# Patient Record
Sex: Female | Born: 1960 | Hispanic: Yes | Marital: Married | Smoking: Never smoker
Health system: Southern US, Community
[De-identification: ages and names within clinical notes are randomized; demographics above are authoritative.]

## PROBLEM LIST (undated history)

## (undated) DIAGNOSIS — E7800 Pure hypercholesterolemia, unspecified: Secondary | ICD-10-CM

## (undated) HISTORY — PX: ABDOMINAL HYSTERECTOMY: SHX81

---

## 2011-04-02 ENCOUNTER — Observation Stay: Payer: Self-pay

## 2011-05-01 ENCOUNTER — Ambulatory Visit: Payer: Self-pay | Admitting: Obstetrics & Gynecology

## 2011-05-24 ENCOUNTER — Inpatient Hospital Stay: Payer: Self-pay | Admitting: Obstetrics & Gynecology

## 2011-05-28 LAB — PATHOLOGY REPORT

## 2013-06-14 ENCOUNTER — Ambulatory Visit: Payer: Self-pay | Admitting: Family Medicine

## 2013-09-23 ENCOUNTER — Ambulatory Visit: Payer: Self-pay | Admitting: Family Medicine

## 2013-10-26 ENCOUNTER — Ambulatory Visit: Payer: Self-pay | Admitting: Family Medicine

## 2014-12-21 ENCOUNTER — Ambulatory Visit
Admission: RE | Admit: 2014-12-21 | Discharge: 2014-12-21 | Disposition: A | Discharge status: Home / Self Care | Payer: Self-pay | Source: Ambulatory Visit | Attending: Oncology | Admitting: Oncology

## 2014-12-21 ENCOUNTER — Ambulatory Visit: Payer: BLUE CROSS/BLUE SHIELD | Attending: Oncology

## 2014-12-21 VITALS — BP 118/74 | HR 71 | Temp 97.1°F | Resp 18 | Ht 61.42 in | Wt 148.8 lb

## 2014-12-21 DIAGNOSIS — Z0000 Encounter for general adult medical examination without abnormal findings: Secondary | ICD-10-CM

## 2014-12-21 NOTE — Progress Notes (Signed)
Subjective:     Patient ID: Sharee Pimplentonia Barbosa Valerio, female   DOB: 1961/02/28, 54 y.o.   MRN: [id number]  HPI   Review of Systems     Objective:   Physical Exam  Pulmonary/Chest: Right breast exhibits no inverted nipple, no mass, no nipple discharge, no skin change and no tenderness. Left breast exhibits no inverted nipple, no mass, no nipple discharge, no skin change and no tenderness. Breasts are symmetrical.       Assessment:     54 year old patient presents for [facility] clinic visit.  Patient screened, and meets BCCCP eligibility.  Patient does not have insurance, Medicare or Medicaid.  Handout given on Affordable Care Act. Instructed patient on breast self-exam using teach back method.  CBE unremarkable    Plan:     Sent for bilateral screening mammogram.  Language line used for interpretation of exam.

## 2014-12-23 ENCOUNTER — Other Ambulatory Visit: Payer: Self-pay | Admitting: Oncology

## 2014-12-23 DIAGNOSIS — R928 Other abnormal and inconclusive findings on diagnostic imaging of breast: Secondary | ICD-10-CM

## 2014-12-23 DIAGNOSIS — N6489 Other specified disorders of breast: Secondary | ICD-10-CM

## 2015-01-02 ENCOUNTER — Ambulatory Visit
Admission: RE | Admit: 2015-01-02 | Discharge: 2015-01-02 | Disposition: A | Discharge status: Home / Self Care | Payer: BLUE CROSS/BLUE SHIELD | Source: Ambulatory Visit | Attending: Oncology | Admitting: Oncology

## 2015-01-02 ENCOUNTER — Ambulatory Visit
Admission: RE | Admit: 2015-01-02 | Discharge: 2015-01-02 | Disposition: A | Discharge status: Home / Self Care | Payer: Self-pay | Source: Ambulatory Visit | Attending: Oncology | Admitting: Oncology

## 2015-01-02 DIAGNOSIS — N6489 Other specified disorders of breast: Secondary | ICD-10-CM

## 2015-01-02 DIAGNOSIS — R928 Other abnormal and inconclusive findings on diagnostic imaging of breast: Secondary | ICD-10-CM

## 2015-01-09 ENCOUNTER — Encounter: Payer: Self-pay | Admitting: *Deleted

## 2015-01-24 ENCOUNTER — Other Ambulatory Visit: Payer: Self-pay

## 2015-01-24 DIAGNOSIS — N63 Unspecified lump in unspecified breast: Secondary | ICD-10-CM

## 2015-01-27 NOTE — Progress Notes (Signed)
Letter mailed to patient to notify of 6 month follow-up mammogram and ultrasound of left breast.  Scheduled for 07/06/15 at 0920 in the [facility].

## 2015-07-06 ENCOUNTER — Other Ambulatory Visit: Payer: BLUE CROSS/BLUE SHIELD

## 2015-07-06 ENCOUNTER — Ambulatory Visit: Payer: BLUE CROSS/BLUE SHIELD | Attending: Oncology

## 2017-04-09 ENCOUNTER — Ambulatory Visit: Payer: Self-pay | Attending: Oncology

## 2017-04-09 ENCOUNTER — Encounter (INDEPENDENT_AMBULATORY_CARE_PROVIDER_SITE_OTHER): Payer: Self-pay

## 2017-04-09 VITALS — BP 125/81 | HR 73 | Temp 97.4°F | Resp 18 | Ht 60.0 in | Wt 142.0 lb

## 2017-04-09 DIAGNOSIS — N63 Unspecified lump in unspecified breast: Secondary | ICD-10-CM

## 2017-04-09 NOTE — Progress Notes (Addendum)
Subjective:     Patient ID: Carolyn Hogan, female   DOB: 1960-08-21, 56 y.o.   MRN: [id number]  HPI   Review of Systems     Objective:   Physical Exam  Pulmonary/Chest: Right breast exhibits no inverted nipple, no mass, no nipple discharge, no skin change and no tenderness. Left breast exhibits no inverted nipple, no mass, no nipple discharge, no skin change and no tenderness. Breasts are symmetrical.       Assessment:     56 year old hispanic patient patient presents for [facility] clinic visit.  Patient was to return in January 2017 for six month follow-up mammogram, but she was in [place]. Patient screened, and meets BCCCP eligibility.  Patient does not have insurance, Medicare or Medicaid.  Handout given on Affordable Care Act.  Instructed patient on breast self-exam using teach back method.  CBE unremarkable.  No mass or lump palpated.  Carolyn Hogan interpreted exam.    Plan:     Carolyn Hogan to scheduled patient to return for bilateral diagnostic mammogram, and ultrasound.  Patient reports her father is in hospital in [place].  Instructed to call to reschedule if she needs to leave to go to [place].

## 2017-05-13 ENCOUNTER — Ambulatory Visit
Admission: RE | Admit: 2017-05-13 | Discharge: 2017-05-13 | Disposition: A | Discharge status: Home / Self Care | Payer: Self-pay | Source: Ambulatory Visit | Attending: Oncology | Admitting: Oncology

## 2017-05-13 DIAGNOSIS — N63 Unspecified lump in unspecified breast: Secondary | ICD-10-CM

## 2017-05-13 DIAGNOSIS — R928 Other abnormal and inconclusive findings on diagnostic imaging of breast: Secondary | ICD-10-CM | POA: Insufficient documentation

## 2017-05-16 NOTE — Progress Notes (Signed)
Radiologist reported, and discussed Birads 2 findings with patient.  Patient to return in one year for annual screening.  Copy to [facility].

## 2018-06-15 ENCOUNTER — Ambulatory Visit: Payer: Self-pay | Attending: Oncology

## 2018-06-15 ENCOUNTER — Ambulatory Visit
Admission: RE | Admit: 2018-06-15 | Discharge: 2018-06-15 | Disposition: A | Discharge status: Home / Self Care | Payer: Self-pay | Source: Ambulatory Visit | Attending: Oncology | Admitting: Oncology

## 2018-06-15 VITALS — BP 115/71 | HR 60 | Temp 98.1°F | Ht 61.5 in | Wt 142.3 lb

## 2018-06-15 DIAGNOSIS — Z0000 Encounter for general adult medical examination without abnormal findings: Secondary | ICD-10-CM | POA: Insufficient documentation

## 2018-06-15 NOTE — Progress Notes (Signed)
  Subjective:     Patient ID: Carolyn Hogan, female   DOB: April 26, 1961, 58 y.o.   MRN: [id number]  HPI   Review of Systems     Objective:   Physical Exam Chest:     Breasts: Breasts are symmetrical.        Right: No swelling, bleeding, inverted nipple, mass, nipple discharge, skin change or tenderness.        Left: No swelling, bleeding, inverted nipple, mass, nipple discharge, skin change or tenderness.        Assessment:     58 [facility] hispanic [facility].  Patient screened, and meets [facility]CP eligibility.  Patient does not have insurance, Medicare or Medicaid.  Handout given on Affordable Care Act.   Instructed patient on breast self awareness using teach back method.  Clinical breast exam unremarkable.  Patient reports she does not have family here except her husband, but her neighbor is with her.  Virtual interpreter service used to interpret exam.  Risk Assessment    Risk Scores      06/15/2018   Last edited by: Alta Corningover, Melissa G, CMA   5-year risk: 0.8 %   Lifetime risk: 5.8 %             Plan:     Sent for bilateral screening mammogram.

## 2018-06-17 NOTE — Progress Notes (Signed)
Letter mailed from [facility] to notify of normal mammogram results.  Patient to return in one year for annual screening.  Copy to [facility]. 

## 2019-09-15 ENCOUNTER — Other Ambulatory Visit: Payer: Self-pay | Admitting: Family Medicine

## 2019-09-15 DIAGNOSIS — Z1231 Encounter for screening mammogram for malignant neoplasm of breast: Secondary | ICD-10-CM

## 2019-09-24 ENCOUNTER — Ambulatory Visit
Admission: RE | Admit: 2019-09-24 | Discharge: 2019-09-24 | Disposition: A | Discharge status: Home / Self Care | Payer: BC Managed Care – PPO | Source: Ambulatory Visit | Attending: Family Medicine | Admitting: Family Medicine

## 2019-09-24 DIAGNOSIS — Z1231 Encounter for screening mammogram for malignant neoplasm of breast: Secondary | ICD-10-CM | POA: Insufficient documentation

## 2021-09-19 ENCOUNTER — Other Ambulatory Visit: Payer: Self-pay | Admitting: Student

## 2021-09-19 DIAGNOSIS — Z1231 Encounter for screening mammogram for malignant neoplasm of breast: Secondary | ICD-10-CM

## 2021-10-24 ENCOUNTER — Ambulatory Visit
Admission: RE | Admit: 2021-10-24 | Discharge: 2021-10-24 | Disposition: A | Discharge status: Home / Self Care | Payer: BC Managed Care – PPO | Source: Ambulatory Visit | Attending: Student | Admitting: Student

## 2021-10-24 DIAGNOSIS — Z1231 Encounter for screening mammogram for malignant neoplasm of breast: Secondary | ICD-10-CM | POA: Insufficient documentation

## 2022-07-04 ENCOUNTER — Other Ambulatory Visit: Payer: Self-pay | Admitting: Orthopedic Surgery

## 2022-07-08 ENCOUNTER — Encounter: Payer: Self-pay | Admitting: Orthopedic Surgery

## 2022-07-08 NOTE — Anesthesia Preprocedure Evaluation (Addendum)
Anesthesia Evaluation  Patient identified by MRN, date of birth, ID band Patient awake    Reviewed: Allergy & Precautions, H&P , NPO status , Patient's Chart, lab work & pertinent test results  History of Anesthesia Complications Negative for: history of anesthetic complications  Airway Mallampati: IV   Neck ROM: Full    Dental  (+) Missing   Pulmonary neg pulmonary ROS   Pulmonary exam normal breath sounds clear to auscultation       Cardiovascular negative cardio ROS Normal cardiovascular exam Rhythm:Regular Rate:Normal     Neuro/Psych negative neurological ROS     GI/Hepatic negative GI ROS,,,  Endo/Other  negative endocrine ROS    Renal/GU negative Renal ROS     Musculoskeletal   Abdominal   Peds  Hematology negative hematology ROS (+)   Anesthesia Other Findings   Reproductive/Obstetrics                             Anesthesia Physical Anesthesia Plan  ASA: 1  Anesthesia Plan: General and Regional   Post-op Pain Management: Regional block   Induction: Intravenous  PONV Risk Score and Plan: 1 and Treatment may vary due to age or medical condition and Ondansetron  Airway Management Planned: LMA  Additional Equipment:   Intra-op Plan:   Post-operative Plan: Extubation in OR  Informed Consent: I have reviewed the patients History and Physical, chart, labs and discussed the procedure including the risks, benefits and alternatives for the proposed anesthesia with the patient or authorized representative who has indicated his/her understanding and acceptance.     Dental advisory given  Plan Discussed with: CRNA  Anesthesia Plan Comments: (Plan for preoperative supraclavicular nerve block and GA with LMA.  Patient consented for risks of anesthesia including but not limited to:  - adverse reactions to medications - damage to eyes, teeth, lips or other oral mucosa - nerve  damage due to positioning  - sore throat or hoarseness - damage to heart, brain, nerves, lungs, other parts of body or loss of life  Informed patient about role of CRNA in peri- and intra-operative care.  Patient voiced understanding.)        Anesthesia Quick Evaluation

## 2022-07-12 ENCOUNTER — Other Ambulatory Visit: Payer: Self-pay

## 2022-07-12 ENCOUNTER — Ambulatory Visit
Admission: RE | Disposition: A | Discharge status: Home / Self Care | Payer: Self-pay | Source: Ambulatory Visit | Attending: Orthopedic Surgery

## 2022-07-12 ENCOUNTER — Ambulatory Visit
Admission: RE | Admit: 2022-07-12 | Discharge: 2022-07-12 | Disposition: A | Discharge status: Home / Self Care | Payer: Worker's Compensation | Source: Ambulatory Visit | Attending: Orthopedic Surgery | Admitting: Orthopedic Surgery

## 2022-07-12 ENCOUNTER — Ambulatory Visit: Payer: Self-pay

## 2022-07-12 ENCOUNTER — Ambulatory Visit: Payer: Worker's Compensation | Admitting: Anesthesiology

## 2022-07-12 DIAGNOSIS — X58XXXA Exposure to other specified factors, initial encounter: Secondary | ICD-10-CM | POA: Insufficient documentation

## 2022-07-12 DIAGNOSIS — S52572A Other intraarticular fracture of lower end of left radius, initial encounter for closed fracture: Secondary | ICD-10-CM | POA: Diagnosis present

## 2022-07-12 HISTORY — DX: Pure hypercholesterolemia, unspecified: E78.00

## 2022-07-12 HISTORY — PX: OPEN REDUCTION INTERNAL FIXATION (ORIF) DISTAL RADIAL FRACTURE: SHX5989

## 2022-07-12 SURGERY — OPEN REDUCTION INTERNAL FIXATION (ORIF) DISTAL RADIUS FRACTURE
Anesthesia: Regional | Site: Hand | Laterality: Left

## 2022-07-12 MED ORDER — MIDAZOLAM HCL 5 MG/5ML IJ SOLN
INTRAMUSCULAR | Status: DC | PRN
  Administered 2022-07-12: 1 mg via INTRAVENOUS

## 2022-07-12 MED ORDER — FENTANYL CITRATE PF 50 MCG/ML IJ SOSY: 25.0000 ug | PREFILLED_SYRINGE | INTRAMUSCULAR | Status: DC | PRN

## 2022-07-12 MED ORDER — OXYCODONE HCL 5 MG/5ML PO SOLN: 5.0000 mg | Freq: Once | ORAL | Status: AC | PRN

## 2022-07-12 MED ORDER — FENTANYL CITRATE (PF) 100 MCG/2ML IJ SOLN
INTRAMUSCULAR | Status: DC | PRN
  Administered 2022-07-12 (×2): 50 ug via INTRAVENOUS

## 2022-07-12 MED ORDER — HYDROCODONE-ACETAMINOPHEN 7.5-325 MG PO TABS: 1.0000 | ORAL_TABLET | ORAL | Status: DC | PRN

## 2022-07-12 MED ORDER — DROPERIDOL 2.5 MG/ML IJ SOLN: 0.6250 mg | Freq: Once | INTRAMUSCULAR | Status: DC | PRN

## 2022-07-12 MED ORDER — ACETAMINOPHEN 325 MG PO TABS: 325.0000 mg | ORAL_TABLET | Freq: Four times a day (QID) | ORAL | Status: DC | PRN

## 2022-07-12 MED ORDER — METOCLOPRAMIDE HCL 5 MG/ML IJ SOLN: 5.0000 mg | Freq: Three times a day (TID) | INTRAMUSCULAR | Status: DC | PRN

## 2022-07-12 MED ORDER — METOCLOPRAMIDE HCL 5 MG PO TABS: 5.0000 mg | ORAL_TABLET | Freq: Three times a day (TID) | ORAL | Status: DC | PRN

## 2022-07-12 MED ORDER — ONDANSETRON HCL 4 MG/2ML IJ SOLN
INTRAMUSCULAR | Status: DC | PRN
  Administered 2022-07-12: 4 mg via INTRAVENOUS

## 2022-07-12 MED ORDER — BUPIVACAINE HCL (PF) 0.5 % IJ SOLN
INTRAMUSCULAR | Status: DC | PRN
  Administered 2022-07-12: 20 mL

## 2022-07-12 MED ORDER — ONDANSETRON HCL 4 MG PO TABS: 4.0000 mg | ORAL_TABLET | Freq: Four times a day (QID) | ORAL | Status: DC | PRN

## 2022-07-12 MED ORDER — PROPOFOL 10 MG/ML IV BOLUS
INTRAVENOUS | Status: DC | PRN
  Administered 2022-07-12: 100 mg via INTRAVENOUS

## 2022-07-12 MED ORDER — ACETAMINOPHEN 10 MG/ML IV SOLN: 1000.0000 mg | Freq: Once | INTRAVENOUS | Status: DC | PRN

## 2022-07-12 MED ORDER — DEXAMETHASONE SODIUM PHOSPHATE 4 MG/ML IJ SOLN
INTRAMUSCULAR | Status: DC | PRN
  Administered 2022-07-12: 4 mg via INTRAVENOUS

## 2022-07-12 MED ORDER — HYDROCODONE-ACETAMINOPHEN 5-325 MG PO TABS: 1.0000 | ORAL_TABLET | ORAL | 0 refills | Status: AC | PRN

## 2022-07-12 MED ORDER — CEFAZOLIN SODIUM-DEXTROSE 2-4 GM/100ML-% IV SOLN
2.0000 g | INTRAVENOUS | Status: AC
  Administered 2022-07-12: 2 g via INTRAVENOUS

## 2022-07-12 MED ORDER — MORPHINE SULFATE (PF) 2 MG/ML IV SOLN: 0.5000 mg | INTRAVENOUS | Status: DC | PRN

## 2022-07-12 MED ORDER — ONDANSETRON HCL 4 MG/2ML IJ SOLN: 4.0000 mg | Freq: Four times a day (QID) | INTRAMUSCULAR | Status: DC | PRN

## 2022-07-12 MED ORDER — PROMETHAZINE HCL 25 MG/ML IJ SOLN: 6.2500 mg | INTRAMUSCULAR | Status: DC | PRN

## 2022-07-12 MED ORDER — HYDROCODONE-ACETAMINOPHEN 5-325 MG PO TABS: 1.0000 | ORAL_TABLET | ORAL | Status: DC | PRN

## 2022-07-12 MED ORDER — LACTATED RINGERS IV SOLN: INTRAVENOUS | Status: DC

## 2022-07-12 MED ORDER — OXYCODONE HCL 5 MG PO TABS
5.0000 mg | ORAL_TABLET | Freq: Once | ORAL | Status: AC | PRN
  Administered 2022-07-12: 5 mg via ORAL

## 2022-07-12 MED ORDER — 0.9 % SODIUM CHLORIDE (POUR BTL) OPTIME: TOPICAL | Status: DC | PRN

## 2022-07-12 MED ORDER — SODIUM CHLORIDE 0.9 % IV SOLN: INTRAVENOUS | Status: DC

## 2022-07-12 MED ORDER — EPHEDRINE SULFATE (PRESSORS) 50 MG/ML IJ SOLN
INTRAMUSCULAR | Status: DC | PRN
  Administered 2022-07-12 (×3): 5 mg via INTRAVENOUS

## 2022-07-12 SURGICAL SUPPLY — 38 items
APL PRP STRL LF DISP 70% ISPRP (MISCELLANEOUS) ×1
BIT DRILL 2 FAST STEP (BIT) IMPLANT
BIT DRILL 2.5X4 QC (BIT) IMPLANT
BNDG ELASTIC 4X5.8 VLCR STR LF (GAUZE/BANDAGES/DRESSINGS) ×1 IMPLANT
CHLORAPREP W/TINT 26 (MISCELLANEOUS) ×1 IMPLANT
COVER LIGHT HANDLE UNIVERSAL (MISCELLANEOUS) ×2 IMPLANT
CUFF TOURN SGL QUICK 18X4 (TOURNIQUET CUFF) IMPLANT
DRAPE FLUOR MINI C-ARM 54X84 (DRAPES) ×1 IMPLANT
ELECT REM PT RETURN 9FT ADLT (ELECTROSURGICAL) ×1
ELECTRODE REM PT RTRN 9FT ADLT (ELECTROSURGICAL) ×1 IMPLANT
GAUZE SPONGE 4X4 12PLY STRL (GAUZE/BANDAGES/DRESSINGS) ×1 IMPLANT
GAUZE XEROFORM 1X8 LF (GAUZE/BANDAGES/DRESSINGS) ×2 IMPLANT
GLOVE SURG SYN 9.0  PF PI (GLOVE) ×1
GLOVE SURG SYN 9.0 PF PI (GLOVE) ×1 IMPLANT
GOWN STRL REUS W/ TWL LRG LVL3 (GOWN DISPOSABLE) ×1 IMPLANT
GOWN STRL REUS W/TWL LRG LVL3 (GOWN DISPOSABLE) ×1
K-WIRE 1.6 (WIRE) ×1
K-WIRE FX5X1.6XNS BN SS (WIRE) ×1
KIT TURNOVER KIT A (KITS) ×1 IMPLANT
KWIRE FX5X1.6XNS BN SS (WIRE) IMPLANT
NS IRRIG 500ML POUR BTL (IV SOLUTION) ×1 IMPLANT
PACK EXTREMITY ARMC (MISCELLANEOUS) ×1 IMPLANT
PAD CAST 4YDX4 CTTN HI CHSV (CAST SUPPLIES) ×2 IMPLANT
PADDING CAST COTTON 4X4 STRL (CAST SUPPLIES) ×2
PEG SUBCHONDRAL SMOOTH 2.0X14 (Peg) IMPLANT
PEG SUBCHONDRAL SMOOTH 2.0X18 (Peg) IMPLANT
PEG SUBCHONDRAL SMOOTH 2.0X20 (Peg) IMPLANT
PEG SUBCHONDRAL SMOOTH 2.0X22 (Peg) IMPLANT
PLATE SHORT 57 NRRW LT (Plate) IMPLANT
SCREW CORT 3.5X10 LNG (Screw) ×2 IMPLANT
SCREW MULTI DIRECT 18MM (Screw) IMPLANT
SPLINT CAST 1 STEP 3X12 (MISCELLANEOUS) ×1 IMPLANT
SUT ETHILON 4-0 (SUTURE) ×1
SUT ETHILON 4-0 FS2 18XMFL BLK (SUTURE) ×1
SUT VICRYL 3-0 27IN (SUTURE) ×1 IMPLANT
SUTURE ETHLN 4-0 FS2 18XMF BLK (SUTURE) ×1 IMPLANT
SYR 3ML LL SCALE MARK (SYRINGE) ×1 IMPLANT
WATER STERILE IRR 500ML POUR (IV SOLUTION) ×1 IMPLANT

## 2022-07-12 NOTE — Op Note (Signed)
07/12/2022  2:58 PM  PATIENT:  Carolyn Hogan  62 y.o. female  PRE-OPERATIVE DIAGNOSIS:  Closed Colles' fracture of left radius, initial encounter S52.532A  POST-OPERATIVE DIAGNOSIS:  Closed Colles' fracture of left radius  PROCEDURE:  Procedure(s): OPEN REDUCTION INTERNAL FIXATION (ORIF) DISTAL RADIUS FRACTURE (Left)  SURGEON: Laurene Footman, MD  ASSISTANTS: None  ANESTHESIA:   general  EBL:  Total I/O In: 500 [I.V.:500] Out: 30 [Blood:30]  BLOOD ADMINISTERED:none  DRAINS: none   LOCAL MEDICATIONS USED:  NONE  SPECIMEN:  No Specimen  DISPOSITION OF SPECIMEN:  N/A  COUNTS:  YES  TOURNIQUET:   Total Tourniquet Time Documented: Upper Arm (Left) - 35 minutes Total: Upper Arm (Left) - 35 minutes   IMPLANTS: Biomet standard narrow DVR plate with multiple pegs and screws  DICTATION: .Dragon Dictation  patient was brought to the operating room general anesthesia was obtained.  The left arm was prepped and draped in usual sterile fashion with a tourniquet applied the upper arm.  Fingertrap traction applied with 8 pounds of traction after appropriate patient identification and timeout procedures were completed tourniquet was raised and a volar approach was made centered over the FCR tendon.  The tendon sheath was incised and the tendon retracted radially protect the radial artery and associated veins.  Deep fascia incised and retractor placed exposing the pronator muscle which was elevated off the radial side of the proximal and distal fragments.  The fracture was reduced with a Soil scientist and traction applied to the index and middle fingers.  With the fracture and held in a reduced position the volar plate was applied with a K wire holding it in position followed by placement of all the smooth pegs into the distal fragment with one of the holes it being filled with a multidirectional screw to get better fixation..  The plate was then brought down to the shaft with 4 10  mm cortical screws this gave near anatomic alignment.   At this point the fracture appeared well reduced on AP and lateral imaging with the mini C arm.  The wound was irrigated and tourniquet let down.  The wound was closed with 3-0 Vicryl subcutaneously and 4-0 skin nylon.  Xeroform 4 x 4 web roll volar splint and Ace wrap were then applied.  PLAN OF CARE: Discharge to home after PACU  PATIENT DISPOSITION:  PACU - hemodynamically stable.

## 2022-07-12 NOTE — H&P (Signed)
   Chief Complaint Patient presents with Left Wrist - Pain   History of the Present Illness: Carolyn Hogan is a 62 y.o. female here today.  The patient presents for evaluation of an injury from work. She was seen by internal medicine here at [facility]. She has a comminuted intra-articular fracture of the distal radius with displacement of the left wrist.  She presents with a female companion who states the patient does a lot of physical work and uses both arms constantly. She has not returned to work since the injury.  She is right-hand dominant.  She is employed at [employer].  I have reviewed past medical, surgical, social and family history, and allergies as documented in the EMR.  Past Medical History: History reviewed. No pertinent past medical history.  Past Surgical History: Past Surgical History: Procedure Laterality Date HYSTERECTOMY  Past Family History: Family History Problem Relation Age of Onset Diabetes type II Father  Medications: Current Outpatient Medications Ordered in Epic Medication Sig Dispense Refill HYDROcodone-acetaminophen (NORCO) 5-325 mg tablet Take 1 tablet by mouth every 6 (six) hours as needed for Pain 15 tablet 0  No current Epic-ordered facility-administered medications on file.  Allergies: No Known Allergies  Body mass index is 28.07 kg/m.  Review of Systems: A comprehensive 14 point ROS was performed, reviewed, and the pertinent orthopaedic findings are documented in the HPI.  Vitals: 06/28/22 1059 BP: (!) 156/78   General Physical Examination:  General/Constitutional: No apparent distress: well-nourished and well developed. Eyes: Pupils equal, round with synchronous movement. Lungs: Clear to auscultation HEENT: Normal Vascular: No edema, swelling or tenderness, except as noted in detailed exam. Cardiac: Heart rate and rhythm is regular. Integumentary: No impressive skin lesions present, except as noted in  detailed exam. Neuro/Psych: Normal mood and affect, oriented to person, place, and time.  On exam, sensation intact to the left fingers.  Radiographs:  Prior xrays reviewed, comminuted displaced distal radius.  Assessment: ICD-10-CM 1. Closed Colles' fracture of left radius, initial encounter S52.532A  Plan:  The patient has clinical findings of comminuted intra-articular fracture of the distal radius with displacement of the left wrist.  We will work on getting workers' compensation approval for ORIF of the left wrist with an interpreter here in the office. We have discussed risks, benefits, and possible complications. I would like to get that done next week if we can get authorization. I wrote her a work note stating she is out of work for 6 weeks.  Surgical Risks:  The nature of the condition and the proposed procedure has been reviewed in detail with the patient. Surgical versus non-surgical options and prognosis for recovery have been reviewed and the inherent risks and benefits of each have been discussed including the risks of infection, bleeding, injury to nerves/blood vessels/tendons, incomplete relief of symptoms, persisting pain and/or stiffness, loss of function, complex regional pain syndrome, failure of the procedure, as appropriate.  Document Attestation: I, Janalyn Rouse, have reviewed and updated documentation for [facility], MD, utilizing Nuance DAX.   Electronically signed by Lauris Poag, MD at 06/28/2022 12:22 PM EST   Reviewed  H+P. No changes noted.

## 2022-07-12 NOTE — Anesthesia Postprocedure Evaluation (Signed)
Anesthesia Post Note  Patient: Carolyn Hogan  Procedure(s) Performed: OPEN REDUCTION INTERNAL FIXATION (ORIF) DISTAL RADIUS FRACTURE (Left: Hand)  Patient location during evaluation: PACU Anesthesia Type: Regional Level of consciousness: awake and alert, oriented and patient cooperative Pain management: pain level controlled Vital Signs Assessment: post-procedure vital signs reviewed and stable Respiratory status: spontaneous breathing, nonlabored ventilation and respiratory function stable Cardiovascular status: blood pressure returned to baseline and stable Postop Assessment: adequate PO intake Anesthetic complications: no   No notable events documented.   Last Vitals:  Vitals:   07/12/22 1506 07/12/22 1515  BP:  132/64  Pulse: 71 65  Resp: 18 11  Temp:  (!) 36.2 C  SpO2: 100% 100%    Last Pain:  Vitals:   07/12/22 1515  TempSrc:   PainSc: [place]

## 2022-07-12 NOTE — Transfer of Care (Signed)
Immediate Anesthesia Transfer of Care Note  Patient: Carolyn Hogan  Procedure(s) Performed: OPEN REDUCTION INTERNAL FIXATION (ORIF) DISTAL RADIUS FRACTURE (Left: Hand)  Patient Location: PACU  Anesthesia Type: General, Regional  Level of Consciousness: awake, alert  and patient cooperative  Airway and Oxygen Therapy: Patient Spontanous Breathing and Patient connected to supplemental oxygen  Post-op Assessment: Post-op Vital signs reviewed, Patient's Cardiovascular Status Stable, Respiratory Function Stable, Patent Airway and No signs of Nausea or vomiting  Post-op Vital Signs: Reviewed and stable  Complications: No notable events documented.

## 2022-07-12 NOTE — Anesthesia Procedure Notes (Signed)
Anesthesia Regional Block: Supraclavicular block   Pre-Anesthetic Checklist: , timeout performed,  Correct Patient, Correct Site, Correct Laterality,  Correct Procedure,, risks and benefits discussed,  Surgical consent,  Pre-op evaluation,  At surgeon's request and post-op pain management  Laterality: Left  Prep: chloraprep       Needles:  Injection technique: Single-shot  Needle Type: Echogenic Needle          Additional Needles:   Procedures:,,,, ultrasound used (permanent image in chart),,   Motor weakness within 20 minutes.  Narrative:  Start time: 07/12/2022 12:25 PM End time: 07/12/2022 12:28 PM Injection made incrementally with aspirations every 5 mL.  Performed by: Personally  Anesthesiologist: Darrin Nipper, MD  Additional Notes: Functioning IV was confirmed and monitors applied.  Sterile prep and drape, hand hygiene and sterile gloves were used. Ultrasound guidance: relevant anatomy identified, needle position confirmed, local anesthetic spread visualized around nerve(s), vascular puncture avoided.  Image saved to electronic medical record.  Negative aspiration prior to incremental administration of local anesthetic for total 20 ml bupivacaine 0.5% given in supraclavicular distribution. The patient tolerated the procedure well. Vital signs and moderate sedation medications recorded in RN notes.

## 2022-07-12 NOTE — Discharge Instructions (Signed)
Arm elevated is much as possible Ice to the back of the hand today and tomorrow to help with swelling Leave dressing in place clean and dry Move fingers is much as you can Pain medicine as directed Call office if you are having problems. [phone] 

## 2022-07-12 NOTE — Anesthesia Procedure Notes (Signed)
Procedure Name: LMA Insertion Date/Time: 07/12/2022 1:58 PM  Performed by: Moises Blood, CRNAPre-anesthesia Checklist: Patient identified, Emergency Drugs available, Suction available, Patient being monitored and Timeout performed Patient Re-evaluated:Patient Re-evaluated prior to induction Oxygen Delivery Method: Circle system utilized Preoxygenation: Pre-oxygenation with 100% oxygen Induction Type: IV induction LMA: LMA inserted LMA Size: 4.0 Number of attempts: 1 Placement Confirmation: positive ETCO2 and breath sounds checked- equal and bilateral Dental Injury: Teeth and Oropharynx as per pre-operative assessment

## 2022-07-15 ENCOUNTER — Encounter: Payer: Self-pay | Admitting: Orthopedic Surgery

## 2022-08-06 ENCOUNTER — Ambulatory Visit: Payer: Worker's Compensation | Attending: Orthopedic Surgery | Admitting: Occupational Therapy

## 2022-08-06 ENCOUNTER — Encounter: Payer: Self-pay | Admitting: Occupational Therapy

## 2022-08-06 DIAGNOSIS — M25532 Pain in left wrist: Secondary | ICD-10-CM

## 2022-08-06 DIAGNOSIS — M79642 Pain in left hand: Secondary | ICD-10-CM | POA: Diagnosis present

## 2022-08-06 DIAGNOSIS — M6281 Muscle weakness (generalized): Secondary | ICD-10-CM | POA: Insufficient documentation

## 2022-08-06 DIAGNOSIS — M25632 Stiffness of left wrist, not elsewhere classified: Secondary | ICD-10-CM | POA: Diagnosis present

## 2022-08-06 DIAGNOSIS — L905 Scar conditions and fibrosis of skin: Secondary | ICD-10-CM | POA: Insufficient documentation

## 2022-08-06 DIAGNOSIS — M25642 Stiffness of left hand, not elsewhere classified: Secondary | ICD-10-CM

## 2022-08-06 NOTE — Therapy (Signed)
[facility] [address] [place], [address] Phone: [phone]   Fax:  [phone]  Occupational Therapy Evaluation  Patient Details  Name: Carolyn Hogan MRN: [id number] Date of Birth: 1960-08-12 Referring Provider (OT): [place]   Encounter Date: 08/06/2022   OT End of Session - 08/06/22 1452     Visit Number 1    Number of Visits 18    Date for OT Re-Evaluation 10/01/22    OT Start Time 1315    OT Stop Time 1408    OT Time Calculation (min) 53 min    Activity Tolerance Patient tolerated treatment well    Behavior During Therapy [facility] for tasks assessed/performed             Past Medical History:  Diagnosis Date   Hypercholesteremia     Past Surgical History:  Procedure Laterality Date   ABDOMINAL HYSTERECTOMY     OPEN REDUCTION INTERNAL FIXATION (ORIF) DISTAL RADIAL FRACTURE Left 07/12/2022   Procedure: OPEN REDUCTION INTERNAL FIXATION (ORIF) DISTAL RADIUS FRACTURE;  Surgeon: [name], MD;  Location: [place] [facility];  Service: Orthopedics;  Laterality: Left;    There were no vitals filed for this visit.   Subjective Assessment - 08/06/22 1443     Subjective  I tried to work on my fist at home but it hurts - soaked it it some warm water - tried to take the last strips off but it hurts - brace it just stiff - can I take if off yet    Pertinent History Ortho NOTE 07/26/22 -ORIF, left distal radius - 07/09/22, By Dr. [name] is a 62 y.o. female who presents today status post left distal radius ORIF by Dr. [name] on 07/12/2022. Patient has finished the Norco, has taken occasional ibuprofen only 3 tablets over the last couple of weeks. She is having some pain and swelling throughout the hand and wrist but overall symptoms are improving. She has performed very little exercises on her own, she is guarded secondary to discomfort. She denies any numbness or tingling. Sutures removed.  REFER to OT    Patient Stated Goals Want to get my L hand and wrist better to use it at home and work    Currently in Pain? Yes    Pain Score 4    10/10 in fingers when doing AAROM   Pain Location Hand   wrist   Pain Orientation Left    Pain Descriptors / Indicators Aching;Tightness;Sore;Tender;Numbness    Pain Type Surgical pain    Pain Onset 1 to 4 weeks ago    Pain Frequency Intermittent               [facility] OT Assessment - 08/06/22 0001       Assessment   Medical Diagnosis L ORIF distal radius fx    Referring Provider (OT) Arvella Nigh PA    Onset Date/Surgical Date 07/12/22    Hand Dominance Right      Precautions   Required Braces or Orthoses --   Brace on at all times up and about     [place] With Family      Prior Function   Vocation Full time employment    [place], house work, cooking      AROM   Overall AROM Comments Report some stiffness and pain in shoulder and med elbow    Left Forearm Pronation 90 Degrees  Left Forearm Supination 50 Degrees    Left Wrist Extension 8 Degrees    Left Wrist Flexion 50 Degrees    Left Wrist Radial Deviation 8 Degrees    Left Wrist Ulnar Deviation 20 Degrees      Left Hand AROM   L Thumb MCP 0-60 50 Degrees    L Thumb IP 0-80 45 Degrees    L Thumb Radial ADduction/ABduction 0-55 40    L Thumb Palmar ADduction/ABduction 0-45 50    L Thumb Opposition to Index --   Opposition to 3rd digit   L Index  MCP 0-90 60 Degrees    L Index PIP 0-100 80 Degrees    L Long  MCP 0-90 60 Degrees    L Long PIP 0-100 80 Degrees    L Ring  MCP 0-90 60 Degrees    L Ring PIP 0-100 80 Degrees    L Little  MCP 0-90 40 Degrees    L Little PIP 0-100 70 Degrees            Contrast done with patient prior to review of home program. Removed 4 Steri-Strips that was applied since 07/26/2022. Appear patient had been soaking her arm.  Small area pin sites of drainage. Applied 1 Steri-Strip.  Remind patient to only have  arm under running water. Could not initiate scar massage. Patient limited in pain with active range of motion. Reviewed after contrast with patient using contrast 3 times a day prior to home exercises. Active range of motion for palmar radial abduction of thumb 10 reps 10 reps of tendon glides with composite fist to 2 cm foam block out of palm. Keeping pain under 2/10. Thumb opposition picking up 1 cm up check alternating digits 8 reps Gentle pain-free active range of motion for radial ulnar deviation as well as wrist flexion extension.  8 reps.  Supination pronation with wrist splint on elbow to side 8 reps 10 reps Keeping pain under 1-2/10                  OT Education - 08/06/22 1452     Education Details Findings fof eval and HEP    Person(s) Educated Patient    Methods Explanation;Demonstration;Tactile cues;Verbal cues;Handout    Comprehension Verbal cues required;Returned demonstration;Verbalized understanding              OT Short Term Goals - 08/06/22 1749       OT SHORT TERM GOAL #1   Title Pt to be ind in HEP to decrease edema , pain and increase ROM in digits to touch palm and opposition to all digits    Baseline increase edema, sterristrips in place -pain 4-10/10 in hand and wrist with AROM - limited in flexion of thumb and digits    Time 3    Period Weeks    Status New    Target Date 08/27/22               OT Long Term Goals - 08/06/22 1751       OT LONG TERM GOAL #1   Title L wrist AROM increase to [facility] for pt to use hand to turn doorknob and use more than 50% in bathing and dressing    Baseline Wrist AROM ext 10, flexion 50; sup 50 and RD 8, UD 20- not using hand    Time 6    Period Weeks    Status New    Target Date 09/17/22  OT LONG TERM GOAL #2   Title Scar tissue improve for pt to tolerate scar massage, different textures to facilitate increase AROM at wrist flexion, ext and touching palm with digits    Baseline Sterri  srtrips removed today - applied one again - cannot initate scar massage yet- - decrease wrist and digits AROM    Time 4    Period Weeks    Status New    Target Date 09/03/22      OT LONG TERM GOAL #3   Title L Grip and prehension strenght incease to more than 60% compare to R hand for pt to carry more than 5 lbs, turn doorknob, cook using hand more than 50%    Baseline 3 1/2 wks s/p    Time 8    Period Weeks    Status New    Target Date 10/01/22      OT LONG TERM GOAL #4   Title L wrist and forearm strength increase to WNL for pt to push and pull heavy door, push up and turn doorknob and key without increase symptoms    Baseline 3 1/2 wks s/p - no strength yet    Time 8    Period Weeks    Status New    Target Date 10/01/22                   Plan - 08/06/22 1744     Clinical Impression Statement Pt present 3 1/2 wks s/p L ORIF distal radius fx - surgery by Dr [name] on 07/12/22 - pt in wrist prefab splint and sterri strips still in place. Sutures were removed on 07/26/22. Pt has pin size area that had some drainage- applied 1 sterristip and remind pt to only get hand under running water not soak it. Will cont to assess and initiate scar massage when appropriate. Pt with pain 4-10/10 in digits and wrist with AROM - pt limited in flexion of all digits including thumb -and end range extention of digits. Pt also with decrease L wrist and forearm AROM in all planes - Pt report some numbness in 5th digit and volar thenar eminence- pt can benefit from skilled OT services to decrease edema ,pain and scar tissue to increase ROM and strength in L hand and wrist to return to prior level of function.    OT Occupational Profile and History Problem Focused Assessment - Including review of records relating to presenting problem    Occupational performance deficits (Please refer to evaluation for details): ADL's;IADL's;Leisure;Social Participation;Work;Play    Body Structure / Function / Physical  Skills ADL;IADL;Strength;Pain;Edema;Sensation;Dexterity;Decreased knowledge of precautions;Flexibility;ROM;UE functional use;Scar mobility;FMC    Rehab Potential Good    Clinical Decision Making Limited treatment options, no task modification necessary    Comorbidities Affecting Occupational Performance: None    Modification or Assistance to Complete Evaluation  No modification of tasks or assist necessary to complete eval    OT Frequency 2x / week    OT Duration 8 weeks    OT Treatment/Interventions Self-care/ADL training;Contrast Bath;DME and/or AE instruction;Manual Therapy;Passive range of motion;Scar mobilization;Fluidtherapy;Paraffin;Splinting;Patient/family education;Therapeutic exercise    Consulted and Agree with Plan of Care Patient             Patient will benefit from skilled therapeutic intervention in order to improve the following deficits and impairments:   Body Structure / Function / Physical Skills: ADL, IADL, Strength, Pain, Edema, Sensation, Dexterity, Decreased knowledge of precautions, Flexibility, ROM, UE functional use, Scar mobility,  [facility]       Visit Diagnosis: Pain in left hand  Pain in left wrist  Scar condition and fibrosis of skin  Stiffness of left wrist, not elsewhere classified  Stiffness of left hand, not elsewhere classified  Muscle weakness (generalized)    Problem List There are no problems to display for this patient.   Rosalyn Gess, OTR/L,CLT 08/06/2022, 5:56 PM  [facility] [address] [place], [address] Phone: [phone]   Fax:  [phone]  Name: Carolyn Hogan MRN: [id number] Date of Birth: 1961/06/02

## 2022-08-06 NOTE — Addendum Note (Signed)
Addended by: Rosalyn Gess on: 08/06/2022 06:00 PM   Modules accepted: Orders

## 2022-08-09 ENCOUNTER — Encounter: Payer: Self-pay | Admitting: Orthopedic Surgery

## 2022-08-12 ENCOUNTER — Ambulatory Visit: Payer: Worker's Compensation | Attending: Orthopedic Surgery | Admitting: Occupational Therapy

## 2022-08-12 DIAGNOSIS — L905 Scar conditions and fibrosis of skin: Secondary | ICD-10-CM | POA: Diagnosis present

## 2022-08-12 DIAGNOSIS — M79642 Pain in left hand: Secondary | ICD-10-CM | POA: Insufficient documentation

## 2022-08-12 DIAGNOSIS — M6281 Muscle weakness (generalized): Secondary | ICD-10-CM | POA: Diagnosis present

## 2022-08-12 DIAGNOSIS — M25642 Stiffness of left hand, not elsewhere classified: Secondary | ICD-10-CM | POA: Diagnosis present

## 2022-08-12 DIAGNOSIS — M25532 Pain in left wrist: Secondary | ICD-10-CM | POA: Insufficient documentation

## 2022-08-12 DIAGNOSIS — M25632 Stiffness of left wrist, not elsewhere classified: Secondary | ICD-10-CM | POA: Insufficient documentation

## 2022-08-12 NOTE — Therapy (Signed)
[facility] [address], [place], [address] Phone: [phone]   Fax:  [phone]  Occupational Therapy Treatment  Patient Details  Name: Rinnah Beilstein MRN: [id number] Date of Birth: [birth year] Referring Provider (OT): [place]   Encounter Date: 08/12/2022   OT End of Session - 08/12/22 1434     Visit Number 2    Number of Visits 18    Date for OT Re-Evaluation 10/01/22    OT Start Time 1345    OT Stop Time 1428    OT Time Calculation (min) 43 min    Activity Tolerance Patient tolerated treatment well    Behavior During Therapy [facility] for tasks assessed/performed             Past Medical History:  Diagnosis Date   Hypercholesteremia     Past Surgical History:  Procedure Laterality Date   ABDOMINAL HYSTERECTOMY     OPEN REDUCTION INTERNAL FIXATION (ORIF) DISTAL RADIAL FRACTURE Left 07/12/2022   Procedure: OPEN REDUCTION INTERNAL FIXATION (ORIF) DISTAL RADIUS FRACTURE;  Surgeon: [name], MD;  Location: [place];  Service: Orthopedics;  Laterality: Left;    There were no vitals filed for this visit.   Subjective Assessment - 08/12/22 1432     Subjective  Pain in the back of my hand making fist - it hurts - cannot make fist -massage little my scar with cotton ball    Patient is accompanied by: Interpreter    Pertinent History Ortho NOTE 07/26/22 -ORIF, left distal radius - 07/09/22, By Dr. [name] is a 62 y.o. female who presents today status post left distal radius ORIF by Dr. [name] on 07/12/2022. Patient has finished the Norco, has taken occasional ibuprofen only 3 tablets over the last couple of weeks. She is having some pain and swelling throughout the hand and wrist but overall symptoms are improving. She has performed very little exercises on her own, she is guarded secondary to discomfort. She denies any numbness or tingling. Sutures removed. REFER to OT     Patient Stated Goals Want to get my L hand and wrist better to use it at home and work    Currently in Pain? Yes    Pain Score 8     Pain Location Hand    Pain Orientation Left   posterior hand with making fist   Pain Descriptors / Indicators Aching;Tightness    Pain Type Surgical pain    Pain Onset More than a month ago    Pain Frequency Intermittent                [facility] OT Assessment - 08/12/22 0001       AROM   Left Forearm Pronation 90 Degrees    Left Forearm Supination 60 Degrees    Left Wrist Extension 20 Degrees    Left Wrist Flexion 45 Degrees      Left Hand AROM   L Thumb MCP 0-60 55 Degrees    L Thumb IP 0-80 35 Degrees    L Thumb Radial ADduction/ABduction 0-55 55    L Thumb Palmar ADduction/ABduction 0-45 55    L Thumb Opposition to Index --   Opposition to all digits   L Index  MCP 0-90 60 Degrees    L Index PIP 0-100 90 Degrees    L Long  MCP 0-90 60 Degrees    L Long PIP 0-100 80 Degrees  L Ring  MCP 0-90 60 Degrees    L Ring PIP 0-100 90 Degrees    L Little  MCP 0-90 45 Degrees    L Little PIP 0-100 70 Degrees              Patient arrive with complaints of increased pain over posterior hand with fisting. Observed patient focusing more on DIP PIP flexion active assisted range of motion as well as flexing in to wrist flexion and during attempts of fisting causing discomfort and pain. Patient scar ready this date to initiate scar mobilization and massage. Provided patient with a [employer] scar pad at nighttime Patient added on scar massage 3 times a day.        OT Treatments/Exercises (OP) - 08/12/22 0001       LUE Fluidotherapy   Number Minutes Fluidotherapy 8 Minutes    LUE Fluidotherapy Location Hand;Wrist    Comments AROM for digits with ice 1 rotation             Contrast can be done in the water now.  3 times a day recommended followed by scar massage Active range of motion for palmar /radial abduction of thumb 10  reps Thumb opposition picking up 1 cm up check alternating digits 8 reps Extensively reviewed with patient tendon glides with active assisted range of motion focusing on MC flexion and then composite flexion maintaining MC flexion. Patient also to focus including fifth digit flexion during exercises 10 reps keeping pain to-3/10  Supination /pronation with wrist splint on elbow to side 10 reps Patient to hold off on wrist flexion extension and radial ulnar deviation until Friday's x-ray. Patient to focus on digits flexion as well as thumb. Keeping pain under 1-2/10 Soft tissue mobilization done for metacarpal spreads as well as thumb webspace prior to range of motion.                     OT Education - 08/12/22 1433     Education Details progress and reinforce HEP    Person(s) Educated Patient    Methods Explanation;Demonstration;Tactile cues;Verbal cues;Handout    Comprehension Verbal cues required;Returned demonstration;Verbalized understanding              OT Short Term Goals - 08/06/22 1749       OT SHORT TERM GOAL #1   Title Pt to be ind in HEP to decrease edema , pain and increase ROM in digits to touch palm and opposition to all digits    Baseline increase edema, sterristrips in place -pain 4-10/10 in hand and wrist with AROM - limited in flexion of thumb and digits    Time 3    Period Weeks    Status New    Target Date 08/27/22               OT Long Term Goals - 08/06/22 1751       OT LONG TERM GOAL #1   Title L wrist AROM increase to [facility] for pt to use hand to turn doorknob and use more than 50% in bathing and dressing    Baseline Wrist AROM ext 10, flexion 50; sup 50 and RD 8, UD 20- not using hand    Time 6    Period Weeks    Status New    Target Date 09/17/22      OT LONG TERM GOAL #2   Title Scar tissue improve for pt to tolerate scar massage, different  textures to facilitate increase AROM at wrist flexion, ext and touching palm with digits     Baseline Sterri srtrips removed today - applied one again - cannot initate scar massage yet- - decrease wrist and digits AROM    Time 4    Period Weeks    Status New    Target Date 09/03/22      OT LONG TERM GOAL #3   Title L Grip and prehension strenght incease to more than 60% compare to R hand for pt to carry more than 5 lbs, turn doorknob, cook using hand more than 50%    Baseline 3 1/2 wks s/p    Time 8    Period Weeks    Status New    Target Date 10/01/22      OT LONG TERM GOAL #4   Title L wrist and forearm strength increase to WNL for pt to push and pull heavy door, push up and turn doorknob and key without increase symptoms    Baseline 3 1/2 wks s/p - no strength yet    Time 8    Period Weeks    Status New    Target Date 10/01/22                   Plan - 08/12/22 1434     Clinical Impression Statement Pt present 4 1/2 wks s/p L ORIF distal radius fx - surgery by Dr [name] on 07/12/22 -  at eval last week pt has still sterri strip on but had to put back 2. Pt return today for education for scar massage and cica scar pad night time. Pt AROM for L  Flexion of digits about same reinforce multiple times for pt this sessions to block wrist and focus on MC flexion first , then DIP/PIP and then composite PROM but stop when feeling pull- reinforce to keep wrist straigth to decrease pain.  Opposition to all digits and thumb PA and RA improved.  Pt to focus on  digits, thumb and scar this week until MD appt Friday - then will focus on wrist. Pt  with decrease L wrist and forearm AROM in all planes - Pt report some numbness in 5th digit and volar thenar eminence- pt can benefit from skilled OT services to decrease edema ,pain and scar tissue to increase ROM and strength in L hand and wrist to return to prior level of function.    OT Occupational Profile and History Problem Focused Assessment - Including review of records relating to presenting problem    Occupational performance  deficits (Please refer to evaluation for details): ADL's;IADL's;Leisure;Social Participation;Work;Play    Body Structure / Function / Physical Skills ADL;IADL;Strength;Pain;Edema;Sensation;Dexterity;Decreased knowledge of precautions;Flexibility;ROM;UE functional use;Scar mobility;FMC    Rehab Potential Good    Clinical Decision Making Limited treatment options, no task modification necessary    Comorbidities Affecting Occupational Performance: None    Modification or Assistance to Complete Evaluation  No modification of tasks or assist necessary to complete eval    OT Frequency 2x / week    OT Duration 8 weeks    OT Treatment/Interventions Self-care/ADL training;Contrast Bath;DME and/or AE instruction;Manual Therapy;Passive range of motion;Scar mobilization;Fluidtherapy;Paraffin;Splinting;Patient/family education;Therapeutic exercise    Consulted and Agree with Plan of Care Patient             Patient will benefit from skilled therapeutic intervention in order to improve the following deficits and impairments:   Body Structure / Function / Physical Skills: ADL, IADL, Strength,  Pain, Edema, Sensation, Dexterity, Decreased knowledge of precautions, Flexibility, ROM, UE functional use, Scar mobility, [place]       Visit Diagnosis: Pain in left hand  Pain in left wrist  Scar condition and fibrosis of skin  Stiffness of left wrist, not elsewhere classified  Stiffness of left hand, not elsewhere classified  Muscle weakness (generalized)    Problem List There are no problems to display for this patient.   Rosalyn Gess, OTR/L,CLT 08/12/2022, 2:41 PM  [facility] [address], [place], [address] Phone: [phone]   Fax:  [phone]  Name: Madie Randa MRN: [id number] Date of Birth: [birth year]

## 2022-08-13 ENCOUNTER — Encounter: Payer: BC Managed Care – PPO | Admitting: Occupational Therapy

## 2022-08-15 ENCOUNTER — Ambulatory Visit: Payer: Worker's Compensation | Admitting: Occupational Therapy

## 2022-08-15 ENCOUNTER — Encounter: Payer: BC Managed Care – PPO | Admitting: Occupational Therapy

## 2022-08-15 DIAGNOSIS — M25632 Stiffness of left wrist, not elsewhere classified: Secondary | ICD-10-CM | POA: Diagnosis present

## 2022-08-15 DIAGNOSIS — M25532 Pain in left wrist: Secondary | ICD-10-CM | POA: Diagnosis present

## 2022-08-15 DIAGNOSIS — M79642 Pain in left hand: Secondary | ICD-10-CM

## 2022-08-15 DIAGNOSIS — M6281 Muscle weakness (generalized): Secondary | ICD-10-CM | POA: Diagnosis present

## 2022-08-15 DIAGNOSIS — L905 Scar conditions and fibrosis of skin: Secondary | ICD-10-CM

## 2022-08-15 DIAGNOSIS — M25642 Stiffness of left hand, not elsewhere classified: Secondary | ICD-10-CM

## 2022-08-15 NOTE — Therapy (Signed)
[facility] [address] [place], [address] Phone: [phone]   Fax:  [phone]  Occupational Therapy Treatment  Patient Details  Name: Tonnette Zatorski MRN: [id number] Date of Birth: November 28, 1960 Referring Provider (OT): [place]   Encounter Date: 08/15/2022   OT End of Session - 08/15/22 1258     Visit Number 3    Number of Visits 18    Date for OT Re-Evaluation 10/01/22    OT Start Time 1201    OT Stop Time 1245    OT Time Calculation (min) 44 min    Activity Tolerance Patient tolerated treatment well    Behavior During Therapy [facility] for tasks assessed/performed             Past Medical History:  Diagnosis Date   Hypercholesteremia     Past Surgical History:  Procedure Laterality Date   ABDOMINAL HYSTERECTOMY     OPEN REDUCTION INTERNAL FIXATION (ORIF) DISTAL RADIAL FRACTURE Left 07/12/2022   Procedure: OPEN REDUCTION INTERNAL FIXATION (ORIF) DISTAL RADIUS FRACTURE;  Surgeon: [name], MD;  Location: [place];  Service: Orthopedics;  Laterality: Left;    There were no vitals filed for this visit.   Subjective Assessment - 08/15/22 1225     Subjective  I can tell since last time more motion and strength - can brush my hair now    Pertinent History Ortho NOTE 07/26/22 -ORIF, left distal radius - 07/09/22, By Dr. [name] is a 62 y.o. female who presents today status post left distal radius ORIF by Dr. [name] on 07/12/2022. Patient has finished the Norco, has taken occasional ibuprofen only 3 tablets over the last couple of weeks. She is having some pain and swelling throughout the hand and wrist but overall symptoms are improving. She has performed very little exercises on her own, she is guarded secondary to discomfort. She denies any numbness or tingling. Sutures removed. REFER to OT    Patient Stated Goals Want to get my L hand and wrist better to use it at home and  work    Currently in Pain? Yes    Pain Score 6     Pain Location Hand    Pain Orientation Left    Pain Descriptors / Indicators Aching;Sore;Tightness    Pain Type Surgical pain    Aggravating Factors  making fist                [facility] OT Assessment - 08/15/22 0001       AROM   Left Forearm Pronation 90 Degrees    Left Forearm Supination 85 Degrees    Left Wrist Extension 22 Degrees    Left Wrist Flexion 52 Degrees    Left Wrist Radial Deviation 15 Degrees    Left Wrist Ulnar Deviation 15 Degrees      Left Hand AROM   L Index  MCP 0-90 70 Degrees    L Index PIP 0-100 90 Degrees    L Long  MCP 0-90 75 Degrees    L Long PIP 0-100 90 Degrees    L Ring  MCP 0-90 70 Degrees    L Ring PIP 0-100 95 Degrees    L Little  MCP 0-90 65 Degrees    L Little PIP 0-100 90 Degrees              Coming in with great progress since last visit in [facility] flexion and composite  flexion         OT Treatments/Exercises (OP) - 08/15/22 0001       LUE Fluidotherapy   Number Minutes Fluidotherapy 8 Minutes    LUE Fluidotherapy Location Hand;Wrist    Comments AROM for digits and wrist in all planes              Contrast can be done in the water now.  3 times a day recommended followed by scar massage Review again scar massage - and cica scar pad use night time   Active range of motion for palmar /radial abduction of thumb 10 reps Thumb opposition  alternating digits 8 reps and now sliding down 4th and 5th digits  Extensively reviewed with patient tendon glides  last visit - with active assisted range of motion focusing on MC flexion and then composite flexion maintaining MC flexion. Done PROM composite fist after AAROM tendon glides Done Place and hold touching palm 10 reps   review and add AAROM over edge of table for wrist flexion, ext, RD, UD  10 reps - slight pulle  Keeping pain under 1-2/10 2 x day Soft tissue mobilization done for metacarpal spreads as well as thumb  webspace prior to range of motion.        OT Short Term Goals - 08/06/22 1749       OT SHORT TERM GOAL #1   Title Pt to be ind in HEP to decrease edema , pain and increase ROM in digits to touch palm and opposition to all digits    Baseline increase edema, sterristrips in place -pain 4-10/10 in hand and wrist with AROM - limited in flexion of thumb and digits    Time 3    Period Weeks    Status New    Target Date 08/27/22               OT Long Term Goals - 08/06/22 1751       OT LONG TERM GOAL #1   Title L wrist AROM increase to [facility] for pt to use hand to turn doorknob and use more than 50% in bathing and dressing    Baseline Wrist AROM ext 10, flexion 50; sup 50 and RD 8, UD 20- not using hand    Time 6    Period Weeks    Status New    Target Date 09/17/22      OT LONG TERM GOAL #2   Title Scar tissue improve for pt to tolerate scar massage, different textures to facilitate increase AROM at wrist flexion, ext and touching palm with digits    Baseline Sterri srtrips removed today - applied one again - cannot initate scar massage yet- - decrease wrist and digits AROM    Time 4    Period Weeks    Status New    Target Date 09/03/22      OT LONG TERM GOAL #3   Title L Grip and prehension strenght incease to more than 60% compare to R hand for pt to carry more than 5 lbs, turn doorknob, cook using hand more than 50%    Baseline 3 1/2 wks s/p    Time 8    Period Weeks    Status New    Target Date 10/01/22      OT LONG TERM GOAL #4   Title L wrist and forearm strength increase to WNL for pt to push and pull heavy door, push up and turn doorknob and key  without increase symptoms    Baseline 3 1/2 wks s/p - no strength yet    Time 8    Period Weeks    Status New    Target Date 10/01/22                   Plan - 08/15/22 1258     Clinical Impression Statement Pt present 5 1/2 wks s/p L ORIF distal radius fx - surgery by Dr [name] on 07/12/22 -  at eval last  week pt has still sterri strip on but had to put back 2.  Pt needed reinforcement this week to focus on MC flexion and composite fist- arrive this date with good progress compare to earlier this week. Pain and tightness still 6-8/10 pain over dorsal hand but improving this date. Initiate this date AAROM for wrist flexion, ext and RD, UD but keeping pain under 2/10 for wrist. Cont wrist splint until surgeon appt. Review again scar massage and cica scar pad night time. Opposition to all digits and thumb PA and RA improved.  Pt to focus on composite digits flexion this next few days- appt tomorrow with Ortho.  Pt  with decrease L wrist and forearm AROM in all planes - Pt report some numbness in 5th digit and volar thenar eminence- pt can benefit from skilled OT services to decrease edema ,pain and scar tissue to increase ROM and strength in L hand and wrist to return to prior level of function.    OT Occupational Profile and History Problem Focused Assessment - Including review of records relating to presenting problem    Occupational performance deficits (Please refer to evaluation for details): ADL's;IADL's;Leisure;Social Participation;Work;Play    Body Structure / Function / Physical Skills ADL;IADL;Strength;Pain;Edema;Sensation;Dexterity;Decreased knowledge of precautions;Flexibility;ROM;UE functional use;Scar mobility;FMC    Rehab Potential Good    Clinical Decision Making Limited treatment options, no task modification necessary    Comorbidities Affecting Occupational Performance: None    Modification or Assistance to Complete Evaluation  No modification of tasks or assist necessary to complete eval    OT Frequency 2x / week    OT Duration 8 weeks    OT Treatment/Interventions Self-care/ADL training;Contrast Bath;DME and/or AE instruction;Manual Therapy;Passive range of motion;Scar mobilization;Fluidtherapy;Paraffin;Splinting;Patient/family education;Therapeutic exercise    Consulted and Agree with  Plan of Care Patient             Patient will benefit from skilled therapeutic intervention in order to improve the following deficits and impairments:   Body Structure / Function / Physical Skills: ADL, IADL, Strength, Pain, Edema, Sensation, Dexterity, Decreased knowledge of precautions, Flexibility, ROM, UE functional use, Scar mobility, FMC       Visit Diagnosis: Pain in left hand  Pain in left wrist  Scar condition and fibrosis of skin  Stiffness of left wrist, not elsewhere classified  Stiffness of left hand, not elsewhere classified  Muscle weakness (generalized)    Problem List There are no problems to display for this patient.   Rosalyn Gess, OTR/L,CLT 08/15/2022, 1:05 PM  [facility] [address] [place], [address] Phone: [phone]   Fax:  [phone]  Name: Dott Bluford MRN: [id number] Date of Birth: 1960/11/26

## 2022-08-19 ENCOUNTER — Ambulatory Visit: Payer: Worker's Compensation | Admitting: Occupational Therapy

## 2022-08-19 DIAGNOSIS — M79642 Pain in left hand: Secondary | ICD-10-CM

## 2022-08-19 DIAGNOSIS — M25632 Stiffness of left wrist, not elsewhere classified: Secondary | ICD-10-CM

## 2022-08-19 DIAGNOSIS — M25642 Stiffness of left hand, not elsewhere classified: Secondary | ICD-10-CM

## 2022-08-19 DIAGNOSIS — M25532 Pain in left wrist: Secondary | ICD-10-CM

## 2022-08-19 DIAGNOSIS — L905 Scar conditions and fibrosis of skin: Secondary | ICD-10-CM

## 2022-08-19 DIAGNOSIS — M6281 Muscle weakness (generalized): Secondary | ICD-10-CM

## 2022-08-21 ENCOUNTER — Encounter: Payer: Self-pay | Admitting: Occupational Therapy

## 2022-08-21 NOTE — Therapy (Signed)
[facility] [address] [place], [address] Phone: [phone]   Fax:  [phone]  Occupational Therapy Treatment  Patient Details  Name: Carolyn Hogan MRN: [id number] Date of Birth: [birth year] Referring Provider (OT): [place]   Encounter Date: 08/19/2022   OT End of Session - 08/21/22 1649     Visit Number 4    Number of Visits 18    Date for OT Re-Evaluation 10/01/22    OT Start Time 1345    OT Stop Time 1433    OT Time Calculation (min) 48 min    Activity Tolerance Patient tolerated treatment well    Behavior During Therapy [facility] for tasks assessed/performed             Past Medical History:  Diagnosis Date   Hypercholesteremia     Past Surgical History:  Procedure Laterality Date   ABDOMINAL HYSTERECTOMY     OPEN REDUCTION INTERNAL FIXATION (ORIF) DISTAL RADIAL FRACTURE Left 07/12/2022   Procedure: OPEN REDUCTION INTERNAL FIXATION (ORIF) DISTAL RADIUS FRACTURE;  Surgeon: [name], MD;  Location: [place];  Service: Orthopedics;  Laterality: Left;    There were no vitals filed for this visit.   Subjective Assessment - 08/21/22 1645     Subjective  Pt reports she has not been wearing her compression glove the last few days, reports doctor told her she didn't need it.  Upon reading the ortho note, the PA was actually  referring to the wrist brace.    Pertinent History Ortho NOTE 07/26/22 -ORIF, left distal radius - 07/09/22, By Dr. [name] is a 62 y.o. female who presents today status post left distal radius ORIF by Dr. [name] on 07/12/2022. Patient has finished the Norco, has taken occasional ibuprofen only 3 tablets over the last couple of weeks. She is having some pain and swelling throughout the hand and wrist but overall symptoms are improving. She has performed very little exercises on her own, she is guarded secondary to discomfort. She denies any numbness or  tingling. Sutures removed. REFER to OT    Patient Stated Goals Want to get my L hand and wrist better to use it at home and work    Currently in Pain? Yes    Pain Score 8     Pain Location Hand    Pain Orientation Left    Pain Descriptors / Indicators Aching;Tightness;Sore    Pain Type Surgical pain    Pain Onset More than a month ago    Pain Frequency Intermittent             Interpreter present during session:  Carolyn Hogan Pt reported she went to her doctor appt and was told she didn't need to wear her compression glove any longer.  Therapist reviewed note from ortho appt and PA advised pt she can discontinue wrist brace (not compression glove).  She reports increased edema and pain when attempting to perform tasks without the support of the brace.  We discussed she should continue to use compression glove for edema control and instead of discontinuing the brace all at once, it would be recommended she wean off the brace to allow her wrist to adjust and improve with motion and strength.  Advised pt to remove brace when she is not active but still wear when engaging in heavier demand tasks at home.  She demonstrated understanding.  Fluidotherapy:  Pt seen for use of fluidotherapy for 10  mins to decrease pain, increase tissue mobility and increase active ROM.    Manual Therapy techniques: Following fluidotherapy pt was seen for manual therapy techniques for scar massage to left wrist scar.  Pt reports her family member helped this week with scar massage at home.  Continued review of scar massage techniques, circles, up/down, side to side.   Soft tissue mobilization done for metacarpal spreads as well as thumb webspace prior to range of motion.   Therapeutic Exercise: Active range of motion for palmar /radial abduction of thumb 10 reps Thumb opposition  alternating digits 8-10 reps, with sliding down 4th and 5th digits Tendon glides with active assisted range of motion focusing on MC flexion  and then composite flexion maintaining MC flexion. PROM composite fist after AAROM tendon glides Place and hold touching palm 10 reps   AAROM over edge of table for wrist flexion, wrist ext, radial deviation, ulnar deviation, pt performing 10 reps with slight pull at wrist, recommend Keeping pain under 1-2/10 Pt to perform 2 x day   Recommend pt perform contrast 3 times a day and followed by scar massage Pt to utilize cica scar pad use at night time     OT Education - 08/21/22 1649     Education Details progress and reinforce HEP    Person(s) Educated Patient    Methods Explanation;Demonstration;Tactile cues;Verbal cues;Handout    Comprehension Verbal cues required;Returned demonstration;Verbalized understanding              OT Short Term Goals - 08/06/22 1749       OT SHORT TERM GOAL #1   Title Pt to be ind in HEP to decrease edema , pain and increase ROM in digits to touch palm and opposition to all digits    Baseline increase edema, sterristrips in place -pain 4-10/10 in hand and wrist with AROM - limited in flexion of thumb and digits    Time 3    Period Weeks    Status New    Target Date 08/27/22               OT Long Term Goals - 08/06/22 1751       OT LONG TERM GOAL #1   Title L wrist AROM increase to [facility] for pt to use hand to turn doorknob and use more than 50% in bathing and dressing    Baseline Wrist AROM ext 10, flexion 50; sup 50 and RD 8, UD 20- not using hand    Time 6    Period Weeks    Status New    Target Date 09/17/22      OT LONG TERM GOAL #2   Title Scar tissue improve for pt to tolerate scar massage, different textures to facilitate increase AROM at wrist flexion, ext and touching palm with digits    Baseline Sterri srtrips removed today - applied one again - cannot initate scar massage yet- - decrease wrist and digits AROM    Time 4    Period Weeks    Status New    Target Date 09/03/22      OT LONG TERM GOAL #3   Title L Grip and  prehension strenght incease to more than 60% compare to R hand for pt to carry more than 5 lbs, turn doorknob, cook using hand more than 50%    Baseline 3 1/2 wks s/p    Time 8    Period Weeks    Status New    Target  Date 10/01/22      OT LONG TERM GOAL #4   Title L wrist and forearm strength increase to WNL for pt to push and pull heavy door, push up and turn doorknob and key without increase symptoms    Baseline 3 1/2 wks s/p - no strength yet    Time 8    Period Weeks    Status New    Target Date 10/01/22                   Plan - 08/21/22 1650     Clinical Impression Statement Pt present 5 1/2 wks s/p L ORIF distal radius fx - surgery by Dr [name] on 07/12/22  Pt needed reinforcement this week to focus on [place] flexion and composite fist- arrive this date with good progress compare to earlier this week. Pain and tightness remains 8/10 pain over dorsal hand but improving this date. AAROM for wrist flexion, ext and RD, UD but keeping pain under 2/10 for wrist. scar massage and cica scar pad night time. Opposition to all digits and thumb PA and RA improved.  Pt to focus on composite digits flexion this week.  Pt had an ortho appt this week,Xray report indicates wrist is healing, fracture is stable and volar plate intact. PA note: Discontinue Velcro wrist brace but avoid lifting pushing or pulling anything greater than 1 pound with left upper extremity. Pt was confused about discontinuing brace and reports she stopped using it completely but had increased pain to 10/10 and put it back on a couple times, pain 8/10 on arrival.  Recommend pt wean use of brace, starting with removing when she is sitting and not active but wear brace when she is engaged in tasks that have more activity demands. Continue with use of compression glove, contrast for edema, scar massage and use of cica care along with ROM exercises. Pt can benefit from continued skilled OT services to decrease edema ,pain and scar tissue  to increase ROM and strength in L hand and wrist to return to prior level of function.    OT Occupational Profile and History Problem Focused Assessment - Including review of records relating to presenting problem    Occupational performance deficits (Please refer to evaluation for details): ADL's;IADL's;Leisure;Social Participation;Work;Play    Body Structure / Function / Physical Skills ADL;IADL;Strength;Pain;Edema;Sensation;Dexterity;Decreased knowledge of precautions;Flexibility;ROM;UE functional use;Scar mobility;FMC    Rehab Potential Good    Clinical Decision Making Limited treatment options, no task modification necessary    Comorbidities Affecting Occupational Performance: None    Modification or Assistance to Complete Evaluation  No modification of tasks or assist necessary to complete eval    OT Frequency 2x / week    OT Duration 8 weeks    OT Treatment/Interventions Self-care/ADL training;Contrast Bath;DME and/or AE instruction;Manual Therapy;Passive range of motion;Scar mobilization;Fluidtherapy;Paraffin;Splinting;Patient/family education;Therapeutic exercise    Consulted and Agree with Plan of Care Patient             Patient will benefit from skilled therapeutic intervention in order to improve the following deficits and impairments:   Body Structure / Function / Physical Skills: ADL, IADL, Strength, Pain, Edema, Sensation, Dexterity, Decreased knowledge of precautions, Flexibility, ROM, UE functional use, Scar mobility, FMC       Visit Diagnosis: Pain in left hand  Pain in left wrist  Scar condition and fibrosis of skin  Stiffness of left wrist, not elsewhere classified  Stiffness of left hand, not elsewhere classified  Muscle weakness (generalized)  Problem List There are no problems to display for this patient.  [place] Yarbro Carolyn Hogan, OTR/L, CLT  Kendrea Cerritos, OT 08/21/2022, 8:50 PM  [facility] [address] [place], [address] Phone: [phone]   Fax:  [phone]  Name: Carolyn Hogan MRN: [id number] Date of Birth: August 12, 1960

## 2022-08-22 ENCOUNTER — Ambulatory Visit: Payer: Worker's Compensation | Admitting: Occupational Therapy

## 2022-08-22 DIAGNOSIS — M25532 Pain in left wrist: Secondary | ICD-10-CM

## 2022-08-22 DIAGNOSIS — M79642 Pain in left hand: Secondary | ICD-10-CM | POA: Diagnosis not present

## 2022-08-22 DIAGNOSIS — L905 Scar conditions and fibrosis of skin: Secondary | ICD-10-CM

## 2022-08-22 DIAGNOSIS — M25642 Stiffness of left hand, not elsewhere classified: Secondary | ICD-10-CM

## 2022-08-22 DIAGNOSIS — M25632 Stiffness of left wrist, not elsewhere classified: Secondary | ICD-10-CM

## 2022-08-22 DIAGNOSIS — M6281 Muscle weakness (generalized): Secondary | ICD-10-CM

## 2022-08-22 NOTE — Therapy (Signed)
[facility] [address], [place], [address] Phone: [phone]   Fax:  [phone]  Occupational Therapy Treatment  Patient Details  Name: Carolyn Hogan MRN: [id number] Date of Birth: [birth year] Referring Provider (OT): [place]   Encounter Date: 08/22/2022   OT End of Session - 08/22/22 1549     Visit Number 5    Number of Visits 18    Date for OT Re-Evaluation 10/01/22    OT Start Time 1446    OT Stop Time 1546    OT Time Calculation (min) 60 min    Activity Tolerance Patient tolerated treatment well    Behavior During Therapy [facility] for tasks assessed/performed             Past Medical History:  Diagnosis Date   Hypercholesteremia     Past Surgical History:  Procedure Laterality Date   ABDOMINAL HYSTERECTOMY     OPEN REDUCTION INTERNAL FIXATION (ORIF) DISTAL RADIAL FRACTURE Left 07/12/2022   Procedure: OPEN REDUCTION INTERNAL FIXATION (ORIF) DISTAL RADIUS FRACTURE;  Surgeon: [name], MD;  Location: [place];  Service: Orthopedics;  Laterality: Left;    There were no vitals filed for this visit.   Subjective Assessment - 08/22/22 1547     Subjective  Pt report not wearing her splint as much but her hand really hurts and were swollen earlier today - feels always better after therapy    Pertinent History Ortho NOTE 07/26/22 -ORIF, left distal radius - 07/09/22, By Dr. [name] is a 62 y.o. female who presents today status post left distal radius ORIF by Dr. [name] on 07/12/2022. Patient has finished the Norco, has taken occasional ibuprofen only 3 tablets over the last couple of weeks. She is having some pain and swelling throughout the hand and wrist but overall symptoms are improving. She has performed very little exercises on her own, she is guarded secondary to discomfort. She denies any numbness or tingling. Sutures removed. REFER to OT    Patient Stated Goals  Want to get my L hand and wrist better to use it at home and work    Currently in Pain? Yes    Pain Score 8     Pain Location Hand    Pain Orientation Left    Pain Descriptors / Indicators Aching;Tightness;Sore    Pain Type Surgical pain    Pain Onset More than a month ago    Pain Frequency Intermittent                [facility] OT Assessment - 08/22/22 0001       AROM   Left Forearm Pronation 90 Degrees    Left Forearm Supination 80 Degrees    Left Wrist Extension 20 Degrees    Left Wrist Flexion 50 Degrees    Left Wrist Radial Deviation 10 Degrees    Left Wrist Ulnar Deviation 10 Degrees      Left Hand AROM   L Index  MCP 0-90 65 Degrees    L Long  MCP 0-90 65 Degrees    L Ring  MCP 0-90 65 Degrees    L Little  MCP 0-90 35 Degrees                      OT Treatments/Exercises (OP) - 08/22/22 0001       LUE Fluidotherapy   Number Minutes Fluidotherapy 8 Minutes    LUE  Fluidotherapy Location Hand;Wrist    Comments AROM diigts and wrist - ice 2 rotations 1 min             Contrast 3 times a day prior to ROM and  scar massage Review again scar massage - and cica scar pad use night time  Pt to only do 2-3 min at time -and few times during day Done xtractor with AROM for fisting to decrease adhesion -limiting pt's wrist ext and composite flexion Taping done 3j0 % pull lateral to scar - parallel And 100% pull 2 across    Active range of motion for palmar /radial abduction of thumb 10 reps Thumb opposition  alternating digits 8 reps and now sliding down 4th and 5th digits- BUT REINFORCE thumb RA in between - pt needed max v/c and t/c   REINFORCE again tendon glides  last visit - with active assisted range of motion focusing on MC flexion and then composite flexion maintaining MC flexion. Done PROM composite fist after AAROM tendon glides Done Place and hold touching 2 cm foam block 10 reps    review AAROM over edge of table for wrist flexion, ext, RD,  UD  10 reps - slight pull Keeping pain under 1-2/10 2 x day Can also do prayer stretch 10 reps  Soft tissue mobilization done for metacarpal spreads as well as thumb webspace prior to range of motion.      Pain decrease from 8 to 4/10 pain over dorsal hand        OT Education - 08/22/22 1549     Education Details progress and reinforce HEP    Person(s) Educated Patient    Methods Explanation;Demonstration;Tactile cues;Verbal cues;Handout    Comprehension Verbal cues required;Returned demonstration;Verbalized understanding              OT Short Term Goals - 08/06/22 1749       OT SHORT TERM GOAL #1   Title Pt to be ind in HEP to decrease edema , pain and increase ROM in digits to touch palm and opposition to all digits    Baseline increase edema, sterristrips in place -pain 4-10/10 in hand and wrist with AROM - limited in flexion of thumb and digits    Time 3    Period Weeks    Status New    Target Date 08/27/22               OT Long Term Goals - 08/06/22 1751       OT LONG TERM GOAL #1   Title L wrist AROM increase to [facility] for pt to use hand to turn doorknob and use more than 50% in bathing and dressing    Baseline Wrist AROM ext 10, flexion 50; sup 50 and RD 8, UD 20- not using hand    Time 6    Period Weeks    Status New    Target Date 09/17/22      OT LONG TERM GOAL #2   Title Scar tissue improve for pt to tolerate scar massage, different textures to facilitate increase AROM at wrist flexion, ext and touching palm with digits    Baseline Sterri srtrips removed today - applied one again - cannot initate scar massage yet- - decrease wrist and digits AROM    Time 4    Period Weeks    Status New    Target Date 09/03/22      OT LONG TERM GOAL #3   Title L Grip  and prehension strenght incease to more than 60% compare to R hand for pt to carry more than 5 lbs, turn doorknob, cook using hand more than 50%    Baseline 3 1/2 wks s/p    Time 8    Period  Weeks    Status New    Target Date 10/01/22      OT LONG TERM GOAL #4   Title L wrist and forearm strength increase to WNL for pt to push and pull heavy door, push up and turn doorknob and key without increase symptoms    Baseline 3 1/2 wks s/p - no strength yet    Time 8    Period Weeks    Status New    Target Date 10/01/22                   Plan - 08/22/22 1550     Clinical Impression Statement Pt is about 6 wks s/p L ORIF distal radius fx - surgery by Dr [name] on 07/12/22. Pt return this date again with increase pain 8/10 and edema in hand and wrist since last week appt with ortho pt wearing her splint less and wrist hanging into flexion. Pt present with decrease digits flexion as well as wrist AROM in all  planes. Pt to wear wrist splint still every 2 hrs and can alternate with  Benik neoprene. Fitted with isotoner glove and reviewed again with pt doing contrast, AROM and wearing splints. Pt to focus also on scar massage -adhere and limiting composite fist and wrist extention. Done kinesiotape this date and pt to wear for 48hrs if no issues.   Pt had an ortho appt  last week - Xray report indicates wrist is healing, fracture is stable and volar plate intact. Pt can benefit from continued skilled OT services to decrease edema ,pain and scar tissue to increase ROM and strength in L hand and wrist to return to prior level of function.    OT Occupational Profile and History Problem Focused Assessment - Including review of records relating to presenting problem    Occupational performance deficits (Please refer to evaluation for details): ADL's;IADL's;Leisure;Social Participation;Work;Play    Body Structure / Function / Physical Skills ADL;IADL;Strength;Pain;Edema;Sensation;Dexterity;Decreased knowledge of precautions;Flexibility;ROM;UE functional use;Scar mobility;FMC    Rehab Potential Good    Clinical Decision Making Limited treatment options, no task modification necessary     Comorbidities Affecting Occupational Performance: None    Modification or Assistance to Complete Evaluation  No modification of tasks or assist necessary to complete eval    OT Frequency 2x / week    OT Duration 8 weeks    OT Treatment/Interventions Self-care/ADL training;Contrast Bath;DME and/or AE instruction;Manual Therapy;Passive range of motion;Scar mobilization;Fluidtherapy;Paraffin;Splinting;Patient/family education;Therapeutic exercise    Consulted and Agree with Plan of Care Patient             Patient will benefit from skilled therapeutic intervention in order to improve the following deficits and impairments:   Body Structure / Function / Physical Skills: ADL, IADL, Strength, Pain, Edema, Sensation, Dexterity, Decreased knowledge of precautions, Flexibility, ROM, UE functional use, Scar mobility, FMC       Visit Diagnosis: Pain in left hand  Pain in left wrist  Scar condition and fibrosis of skin  Stiffness of left wrist, not elsewhere classified  Stiffness of left hand, not elsewhere classified  Muscle weakness (generalized)    Problem List There are no problems to display for this patient.   Rosalyn Gess, OTR/L,CLT  08/22/2022, 3:57 PM  [facility] [address] [place], [address] Phone: [phone]   Fax:  [phone]  Name: Carolyn Hogan MRN: [id number] Date of Birth: 1961/01/20

## 2022-08-26 ENCOUNTER — Ambulatory Visit: Payer: Worker's Compensation | Admitting: Occupational Therapy

## 2022-08-26 DIAGNOSIS — M25532 Pain in left wrist: Secondary | ICD-10-CM

## 2022-08-26 DIAGNOSIS — M25632 Stiffness of left wrist, not elsewhere classified: Secondary | ICD-10-CM

## 2022-08-26 DIAGNOSIS — M79642 Pain in left hand: Secondary | ICD-10-CM | POA: Diagnosis not present

## 2022-08-26 DIAGNOSIS — L905 Scar conditions and fibrosis of skin: Secondary | ICD-10-CM

## 2022-08-26 DIAGNOSIS — M6281 Muscle weakness (generalized): Secondary | ICD-10-CM

## 2022-08-26 DIAGNOSIS — M25642 Stiffness of left hand, not elsewhere classified: Secondary | ICD-10-CM

## 2022-08-26 NOTE — Therapy (Signed)
[facility] [address], [place], [address] Phone: [phone]   Fax:  [phone]  Occupational Therapy Treatment  Patient Details  Name: Carolyn Hogan MRN: [id number] Date of Birth: January 26, 1961 Referring Provider (OT): [place]   Encounter Date: 08/26/2022   OT End of Session - 08/26/22 1958     Visit Number 6    Number of Visits 18    Date for OT Re-Evaluation 10/01/22    OT Start Time 1430    OT Stop Time 1516    OT Time Calculation (min) 46 min    Activity Tolerance Patient tolerated treatment well    Behavior During Therapy [facility] for tasks assessed/performed             Past Medical History:  Diagnosis Date   Hypercholesteremia     Past Surgical History:  Procedure Laterality Date   ABDOMINAL HYSTERECTOMY     OPEN REDUCTION INTERNAL FIXATION (ORIF) DISTAL RADIAL FRACTURE Left 07/12/2022   Procedure: OPEN REDUCTION INTERNAL FIXATION (ORIF) DISTAL RADIUS FRACTURE;  Surgeon: [name], MD;  Location: [place];  Service: Orthopedics;  Laterality: Left;    There were no vitals filed for this visit.   Subjective Assessment - 08/26/22 1956     Pertinent History Ortho NOTE 07/26/22 -ORIF, left distal radius - 07/09/22, By Dr. [name] is a 62 y.o. female who presents today status post left distal radius ORIF by Dr. [name] on 07/12/2022. Patient has finished the Norco, has taken occasional ibuprofen only 3 tablets over the last couple of weeks. She is having some pain and swelling throughout the hand and wrist but overall symptoms are improving. She has performed very little exercises on her own, she is guarded secondary to discomfort. She denies any numbness or tingling. Sutures removed. REFER to OT    Patient Stated Goals Want to get my L hand and wrist better to use it at home and work    Currently in Pain? Yes    Pain Score 2     Pain Location --   Wrist and over  dorsal hand with fist   Pain Orientation Left    Pain Descriptors / Indicators Aching;Tightness    Pain Type Surgical pain    Pain Onset More than a month ago                [facility] OT Assessment - 08/26/22 0001       AROM   Left Wrist Extension 30 Degrees    Left Wrist Flexion 55 Degrees    Left Wrist Radial Deviation 20 Degrees    Left Wrist Ulnar Deviation 20 Degrees      Left Hand AROM   L Index  MCP 0-90 65 Degrees    L Index PIP 0-100 10 Degrees    L Long  MCP 0-90 70 Degrees    L Long PIP 0-100 95 Degrees    L Ring  MCP 0-90 70 Degrees    L Ring PIP 0-100 95 Degrees    L Little  MCP 0-90 65 Degrees    L Little PIP 0-100 90 Degrees                      OT Treatments/Exercises (OP) - 08/26/22 0001       LUE Fluidotherapy   Number Minutes Fluidotherapy 8 Minutes    LUE Fluidotherapy Location Hand;Wrist    Comments  AROM digits and wrist AROM             Done xtractor with AROM for fisting  and wrist extention to decrease adhesion -limiting pt's wrist ext and composite flexion Kinesiotaping done 30 % - parallel 2 And 100% pull 2 across    Active range of motion for palmar /radial abduction of thumb 10 reps Thumb opposition  alternating digits 8 reps and now sliding down 4th and 5th digits- BUT REINFORCE thumb RA in between - pt needed max v/c and t/c   REINFORCE again tendon glides   with active assisted range of motion focusing on MC flexion and then composite flexion maintaining MC flexion. Pain mostly in 4th and 5th  Done PROM composite fist after AAROM tendon glides    CPM for wrist extention 2x 200 sec done on BTE- increase to 45 degrees review AAROM over edge of table for wrist flexion, ext, RD, UD  10 reps - slight pull Keeping pain under 1-2/10 2 x day Can also do prayer stretch 10 reps   Done 1 lbs with min A by OT for UD , RD and sup/pro - but wrist drop in flexion -and weak grip in weight  10 reps - min -mod A  Soft tissue  mobilization done for metacarpal spreads as well as thumb webspace prior to range of motion. And graston tool nr 2 for sweeping and brushing over palm and digits - forearm prior to xtractor- mini massager and manual by OT done on scar       Pain decrease from 8 to 4/10 pain over dorsal hand            OT Education - 08/26/22 1958     Education Details progress and reinforce HEP    Person(s) Educated Patient    Methods Explanation;Demonstration;Tactile cues;Verbal cues;Handout    Comprehension Verbal cues required;Returned demonstration;Verbalized understanding              OT Short Term Goals - 08/06/22 1749       OT SHORT TERM GOAL #1   Title Pt to be ind in HEP to decrease edema , pain and increase ROM in digits to touch palm and opposition to all digits    Baseline increase edema, sterristrips in place -pain 4-10/10 in hand and wrist with AROM - limited in flexion of thumb and digits    Time 3    Period Weeks    Status New    Target Date 08/27/22               OT Long Term Goals - 08/06/22 1751       OT LONG TERM GOAL #1   Title L wrist AROM increase to [facility] for pt to use hand to turn doorknob and use more than 50% in bathing and dressing    Baseline Wrist AROM ext 10, flexion 50; sup 50 and RD 8, UD 20- not using hand    Time 6    Period Weeks    Status New    Target Date 09/17/22      OT LONG TERM GOAL #2   Title Scar tissue improve for pt to tolerate scar massage, different textures to facilitate increase AROM at wrist flexion, ext and touching palm with digits    Baseline Sterri srtrips removed today - applied one again - cannot initate scar massage yet- - decrease wrist and digits AROM    Time 4    Period Weeks  Status New    Target Date 09/03/22      OT LONG TERM GOAL #3   Title L Grip and prehension strenght incease to more than 60% compare to R hand for pt to carry more than 5 lbs, turn doorknob, cook using hand more than 50%    Baseline 3  1/2 wks s/p    Time 8    Period Weeks    Status New    Target Date 10/01/22      OT LONG TERM GOAL #4   Title L wrist and forearm strength increase to WNL for pt to push and pull heavy door, push up and turn doorknob and key without increase symptoms    Baseline 3 1/2 wks s/p - no strength yet    Time 8    Period Weeks    Status New    Target Date 10/01/22                   Plan - 08/26/22 1958     Clinical Impression Statement Pt is about 6 1/2 wks s/p L ORIF distal radius fx - surgery by Dr [name] on 07/12/22. Pt  return this date with decrease pain and edema in L hand - increase wrist and digits AROM compare to last week. Pt used Benik neoprene and wrist support on and off since then in combination with isotoner glove. Pt to cont with contrast and adressing MC flexion during composite fist.  Pain increase with [place] /PROM with MC flexion 4th and 5th the worse. Was able to do CPM for wrist extention this date 2 x 200 sec- increase to 45 degrees. Pt to focus on scar massage and AAROM wrist in all planes. Scar still adhere and limiting composite fist and wrist extention. Done kinesiotape this date again and pt to wear for 48hrs if no issues.   Pt had an ortho appt  last week - Xray report indicates wrist is healing, fracture is stable and volar plate intact. Pt can benefit from continued skilled OT services to decrease edema ,pain and scar tissue to increase ROM and strength in L hand and wrist to return to prior level of function.    OT Occupational Profile and History Problem Focused Assessment - Including review of records relating to presenting problem    Occupational performance deficits (Please refer to evaluation for details): ADL's;IADL's;Leisure;Social Participation;Work;Play    Body Structure / Function / Physical Skills ADL;IADL;Strength;Pain;Edema;Sensation;Dexterity;Decreased knowledge of precautions;Flexibility;ROM;UE functional use;Scar mobility;FMC    Rehab Potential Good     Clinical Decision Making Limited treatment options, no task modification necessary    Comorbidities Affecting Occupational Performance: None    Modification or Assistance to Complete Evaluation  No modification of tasks or assist necessary to complete eval    OT Frequency 2x / week    OT Duration 8 weeks    OT Treatment/Interventions Self-care/ADL training;Contrast Bath;DME and/or AE instruction;Manual Therapy;Passive range of motion;Scar mobilization;Fluidtherapy;Paraffin;Splinting;Patient/family education;Therapeutic exercise    Consulted and Agree with Plan of Care Patient             Patient will benefit from skilled therapeutic intervention in order to improve the following deficits and impairments:   Body Structure / Function / Physical Skills: ADL, IADL, Strength, Pain, Edema, Sensation, Dexterity, Decreased knowledge of precautions, Flexibility, ROM, UE functional use, Scar mobility, FMC       Visit Diagnosis: Pain in left hand  Pain in left wrist  Scar condition and fibrosis of skin  Stiffness of left wrist, not elsewhere classified  Stiffness of left hand, not elsewhere classified  Muscle weakness (generalized)    Problem List There are no problems to display for this patient.   Rosalyn Gess, OTR/L,CLT 08/26/2022, 8:04 PM  [facility] [address] [place], [address] Phone: [phone]   Fax:  [phone]  Name: Carolyn Hogan MRN: [id number] Date of Birth: 1961-02-25

## 2022-08-29 ENCOUNTER — Ambulatory Visit: Payer: Worker's Compensation | Admitting: Occupational Therapy

## 2022-08-29 DIAGNOSIS — M25632 Stiffness of left wrist, not elsewhere classified: Secondary | ICD-10-CM

## 2022-08-29 DIAGNOSIS — M79642 Pain in left hand: Secondary | ICD-10-CM | POA: Diagnosis not present

## 2022-08-29 DIAGNOSIS — M25532 Pain in left wrist: Secondary | ICD-10-CM

## 2022-08-29 DIAGNOSIS — L905 Scar conditions and fibrosis of skin: Secondary | ICD-10-CM

## 2022-08-29 DIAGNOSIS — M25642 Stiffness of left hand, not elsewhere classified: Secondary | ICD-10-CM

## 2022-08-29 DIAGNOSIS — M6281 Muscle weakness (generalized): Secondary | ICD-10-CM

## 2022-08-29 NOTE — Therapy (Signed)
[facility] [address] [place], [address] Phone: [phone]   Fax:  [phone]  Occupational Therapy Treatment  Patient Details  Name: Carolyn Hogan MRN: [id number] Date of Birth: [birth year] Referring Provider (OT): [place]   Encounter Date: 08/29/2022   OT End of Session - 08/29/22 1424     Visit Number 7    Number of Visits 18    Date for OT Re-Evaluation 10/01/22    OT Start Time 1324    OT Stop Time 1410    OT Time Calculation (min) 46 min    Activity Tolerance Patient tolerated treatment well    Behavior During Therapy [facility] for tasks assessed/performed             Past Medical History:  Diagnosis Date   Hypercholesteremia     Past Surgical History:  Procedure Laterality Date   ABDOMINAL HYSTERECTOMY     OPEN REDUCTION INTERNAL FIXATION (ORIF) DISTAL RADIAL FRACTURE Left 07/12/2022   Procedure: OPEN REDUCTION INTERNAL FIXATION (ORIF) DISTAL RADIUS FRACTURE;  Surgeon: [name], MD;  Location: [place];  Service: Orthopedics;  Laterality: Left;    There were no vitals filed for this visit.   Subjective Assessment - 08/29/22 1337     Subjective  My swelling and pain better - could not find 1 lbs but did get 2 lbs - but that was heavy -    Pertinent History Ortho NOTE 07/26/22 -ORIF, left distal radius - 07/09/22, By Dr. Deon Hogan is a 62 y.o. female who presents today status post left distal radius ORIF by Dr. [name] on 07/12/2022. Patient has finished the Norco, has taken occasional ibuprofen only 3 tablets over the last couple of weeks. She is having some pain and swelling throughout the hand and wrist but overall symptoms are improving. She has performed very little exercises on her own, she is guarded secondary to discomfort. She denies any numbness or tingling. Sutures removed. REFER to OT    Patient Stated Goals Want to get my L hand and wrist better to use  it at home and work    Currently in Pain? Yes    Pain Score 2     Pain Location Hand   hand, wrist   Pain Orientation Left    Pain Descriptors / Indicators Aching;Tightness    Pain Type Surgical pain    Pain Onset More than a month ago    Pain Frequency Intermittent                [facility] OT Assessment - 08/29/22 0001       AROM   Left Wrist Extension 30 Degrees   40 after CPM     Left Hand AROM   L Index  MCP 0-90 75 Degrees    L Index PIP 0-100 100 Degrees    L Long  MCP 0-90 80 Degrees    L Long PIP 0-100 100 Degrees    L Ring  MCP 0-90 75 Degrees    L Ring PIP 0-100 100 Degrees    L Little  MCP 0-90 70 Degrees    L Little PIP 0-100 100 Degrees                      OT Treatments/Exercises (OP) - 08/29/22 0001       LUE Fluidotherapy   Number Minutes Fluidotherapy 8 Minutes    LUE Fluidotherapy  Location Hand;Wrist    Comments AROM wrist and digits           Soft tissue mobilization done for metacarpal spreads as well as thumb webspace prior to range of motion. And graston tool nr 2 for sweeping and brushing over palm and digits - forearm prior to xtractor- manual by OT done on scar  Done xtractor with AROM for fisting  and wrist extention to decrease adhesion -limiting pt's wrist ext and composite flexion Kinesiotaping done 30 % - parallel 2 And 100% pull 2 across    Active range of motion for palmar /radial abduction of thumb 10 reps Thumb opposition  alternating digits 8 reps and now sliding down 4th and 5th digits- BUT REINFORCE thumb RA in between - pt needed max v/c and t/c   REINFORCE again tendon glides   with active assisted range of motion focusing on MC flexion and then composite flexion maintaining MC flexion. Pain mostly in 4th and 5th  Done PROM composite fist after AAROM tendon glides    CPM for wrist extention 2x 200 sec done on BTE- increase to 40-45 degrees review AAROM over edge of table for wrist flexion, ext, RD, UD  10  reps - slight pull Keeping pain under 1-2/10 2 x day Can also do prayer stretch 10 reps    Done  16oz hammer for UD , RD  10 reps  And  with Benik neoprene wrap on for  sup/pro  on pillow and stop in middle  10 reps pain free   2 x day      Pain today 2/10 in hand - mostly over dorsal hand             OT Education - 08/29/22 1424     Education Details progress and reinforce HEP    Person(s) Educated Patient    Methods Explanation;Demonstration;Tactile cues;Verbal cues;Handout    Comprehension Verbal cues required;Returned demonstration;Verbalized understanding              OT Short Term Goals - 08/06/22 1749       OT SHORT TERM GOAL #1   Title Pt to be ind in HEP to decrease edema , pain and increase ROM in digits to touch palm and opposition to all digits    Baseline increase edema, sterristrips in place -pain 4-10/10 in hand and wrist with AROM - limited in flexion of thumb and digits    Time 3    Period Weeks    Status New    Target Date 08/27/22               OT Long Term Goals - 08/06/22 1751       OT LONG TERM GOAL #1   Title L wrist AROM increase to [facility] for pt to use hand to turn doorknob and use more than 50% in bathing and dressing    Baseline Wrist AROM ext 10, flexion 50; sup 50 and RD 8, UD 20- not using hand    Time 6    Period Weeks    Status New    Target Date 09/17/22      OT LONG TERM GOAL #2   Title Scar tissue improve for pt to tolerate scar massage, different textures to facilitate increase AROM at wrist flexion, ext and touching palm with digits    Baseline Sterri srtrips removed today - applied one again - cannot initate scar massage yet- - decrease wrist and digits AROM  Time 4    Period Weeks    Status New    Target Date 09/03/22      OT LONG TERM GOAL #3   Title L Grip and prehension strenght incease to more than 60% compare to R hand for pt to carry more than 5 lbs, turn doorknob, cook using hand more than 50%     Baseline 3 1/2 wks s/p    Time 8    Period Weeks    Status New    Target Date 10/01/22      OT LONG TERM GOAL #4   Title L wrist and forearm strength increase to WNL for pt to push and pull heavy door, push up and turn doorknob and key without increase symptoms    Baseline 3 1/2 wks s/p - no strength yet    Time 8    Period Weeks    Status New    Target Date 10/01/22                   Plan - 08/29/22 1425     Clinical Impression Statement Pt is about 7 wks s/p L ORIF distal radius fx - surgery by Dr [name] on 07/12/22. Pt  return this date with decrease pain and edema in L hand - increase wrist and digits AROM compare to last week. Pt used Benik neoprene and wrist support on and off since then in combination with isotoner glove. Pt to cont with contrast and adressing MC flexion during composite fist.  Pain increase with [place] /PROM with MC flexion 4th and 5th the worse. Was able to do CPM for wrist extention this date  again increase to 40-45 degrees. Pt to focus on scar massage and AAROM wrist in all planes. Scar still adhere and limiting composite fist and wrist extention. Done kinesiotape this date again and pt to wear for 24-48hrs if no issues.   Pt had an ortho appt  last week - Xray report indicates wrist is healing, fracture is stable and volar plate intact. Did initiate 16oz hammer holding close to head for sup /pro, RD, UD painfree 10 reps - 2 x day -  Pt can benefit from continued skilled OT services to decrease edema ,pain and scar tissue to increase ROM and strength in L hand and wrist to return to prior level of function.    OT Occupational Profile and History Problem Focused Assessment - Including review of records relating to presenting problem    Occupational performance deficits (Please refer to evaluation for details): ADL's;IADL's;Leisure;Social Participation;Work;Play    Body Structure / Function / Physical Skills  ADL;IADL;Strength;Pain;Edema;Sensation;Dexterity;Decreased knowledge of precautions;Flexibility;ROM;UE functional use;Scar mobility;FMC    Rehab Potential Good    Clinical Decision Making Limited treatment options, no task modification necessary    Comorbidities Affecting Occupational Performance: None    Modification or Assistance to Complete Evaluation  No modification of tasks or assist necessary to complete eval    OT Frequency 2x / week    OT Duration 8 weeks    OT Treatment/Interventions Self-care/ADL training;Contrast Bath;DME and/or AE instruction;Manual Therapy;Passive range of motion;Scar mobilization;Fluidtherapy;Paraffin;Splinting;Patient/family education;Therapeutic exercise    Consulted and Agree with Plan of Care Patient             Patient will benefit from skilled therapeutic intervention in order to improve the following deficits and impairments:   Body Structure / Function / Physical Skills: ADL, IADL, Strength, Pain, Edema, Sensation, Dexterity, Decreased knowledge of precautions, Flexibility, ROM, UE  functional use, Scar mobility, [facility]       Visit Diagnosis: Pain in left hand  Pain in left wrist  Stiffness of left wrist, not elsewhere classified  Stiffness of left hand, not elsewhere classified  Scar condition and fibrosis of skin  Muscle weakness (generalized)    Problem List There are no problems to display for this patient.   Rosalyn Gess, OTR/L,CLT 08/29/2022, 4:52 PM  [facility] [address] [place], [address] Phone: [phone]   Fax:  [phone]  Name: Amaria Zervas MRN: [id number] Date of Birth: March 15, 1961

## 2022-09-02 ENCOUNTER — Ambulatory Visit: Payer: Worker's Compensation | Admitting: Occupational Therapy

## 2022-09-02 ENCOUNTER — Encounter: Payer: Self-pay | Admitting: Occupational Therapy

## 2022-09-02 DIAGNOSIS — M79642 Pain in left hand: Secondary | ICD-10-CM | POA: Diagnosis not present

## 2022-09-02 DIAGNOSIS — M6281 Muscle weakness (generalized): Secondary | ICD-10-CM

## 2022-09-02 DIAGNOSIS — L905 Scar conditions and fibrosis of skin: Secondary | ICD-10-CM

## 2022-09-02 DIAGNOSIS — M25642 Stiffness of left hand, not elsewhere classified: Secondary | ICD-10-CM

## 2022-09-02 DIAGNOSIS — M25532 Pain in left wrist: Secondary | ICD-10-CM

## 2022-09-02 DIAGNOSIS — M25632 Stiffness of left wrist, not elsewhere classified: Secondary | ICD-10-CM

## 2022-09-02 NOTE — Therapy (Signed)
[facility] [address] [place], [address] Phone: [phone]   Fax:  [phone]  Occupational Therapy Treatment  Patient Details  Name: Carolyn Hogan MRN: [id number] Date of Birth: 11/06/1960 Referring Provider (OT): [place]   Encounter Date: 09/02/2022   OT End of Session - 09/02/22 1959     Visit Number 8    Number of Visits 18    Date for OT Re-Evaluation 10/01/22    OT Start Time 1347    OT Stop Time 1430    OT Time Calculation (min) 43 min    Activity Tolerance Patient tolerated treatment well    Behavior During Therapy [facility] for tasks assessed/performed             Past Medical History:  Diagnosis Date   Hypercholesteremia     Past Surgical History:  Procedure Laterality Date   ABDOMINAL HYSTERECTOMY     OPEN REDUCTION INTERNAL FIXATION (ORIF) DISTAL RADIAL FRACTURE Left 07/12/2022   Procedure: OPEN REDUCTION INTERNAL FIXATION (ORIF) DISTAL RADIUS FRACTURE;  Surgeon: [name], MD;  Location: [place];  Service: Orthopedics;  Laterality: Left;    There were no vitals filed for this visit.   Subjective Assessment - 09/02/22 1949     Subjective  Pt arrived but without an interpreter this date, pt normally has an interpreter from worker's comp.  Called worker's comp and they were able to provide an interpreter over the phone for the session today, named Carolyn Hogan.  Therapist provided interpreter with patient's next session date for Thursday so they can make arrangements for one next session.    Pertinent History Ortho NOTE 07/26/22 -ORIF, left distal radius - 07/09/22, By Dr. [name] is a 62 y.o. female who presents today status post left distal radius ORIF by Dr. [name] on 07/12/2022. Patient has finished the Norco, has taken occasional ibuprofen only 3 tablets over the last couple of weeks. She is having some pain and swelling throughout the hand and wrist but  overall symptoms are improving. She has performed very little exercises on her own, she is guarded secondary to discomfort. She denies any numbness or tingling. Sutures removed. REFER to OT    Patient Stated Goals Want to get my L hand and wrist better to use it at home and work    Currently in Pain? Yes    Pain Score 2     Pain Location Hand    Pain Orientation Left    Pain Descriptors / Indicators Aching;Tightness    Pain Onset More than a month ago    Pain Frequency Intermittent            Interpreter by phone:  Carolyn Hogan  Fluidotherapy: Pt seen for use of fluidotherapy to left hand for 10 mins while performing active range of motion to hand while in fluido to decrease pain, increase tissue mobility and increase ROM.   Manual Therapy: Pt seen this date for soft tissue mobilization, performed metacarpal spreads as well as thumb webspace prior to range of motion.Use of graston tool nr 2 for sweeping and brushing over palm and digits and forearm.  Scar massage followed by use of xtractor- manual by OT performed on scar.  xtractor with AROM for fisting and wrist extension to decrease adhesion limiting pt's wrist ext and composite flexion Kinesiotaping applied 30 % stretch placed parallel to scar, 2 strips, 3 strips placed across with 100% pull  Therapeutic Exercise:  Active range of motion for palmar /radial abduction of thumb 10 reps Thumb opposition alternating digits 8 reps and now sliding down 4th and 5th digits with promotion of thumb RA in between, verbal cues and demo   Continue to reinforce tendon glides with active assisted range of motion focusing on MC flexion and then composite flexion maintaining MC flexion. PROM composite fist after AAROM tendon glides   AAROM over edge of table for wrist flexion, ext, RD, UD 10 reps Prayer stretch 10 reps  16oz hammer for UD, RD  10 reps with Benik neoprene wrap on for  sup/pro on pillow and stop in middle        OT Education - 09/02/22  1958     Education Details progress and reinforce HEP    Person(s) Educated Patient    Methods Explanation;Demonstration;Tactile cues;Verbal cues;Handout    Comprehension Verbal cues required;Returned demonstration;Verbalized understanding              OT Short Term Goals - 08/06/22 1749       OT SHORT TERM GOAL #1   Title Pt to be ind in HEP to decrease edema , pain and increase ROM in digits to touch palm and opposition to all digits    Baseline increase edema, sterristrips in place -pain 4-10/10 in hand and wrist with AROM - limited in flexion of thumb and digits    Time 3    Period Weeks    Status New    Target Date 08/27/22               OT Long Term Goals - 08/06/22 1751       OT LONG TERM GOAL #1   Title L wrist AROM increase to [facility] for pt to use hand to turn doorknob and use more than 50% in bathing and dressing    Baseline Wrist AROM ext 10, flexion 50; sup 50 and RD 8, UD 20- not using hand    Time 6    Period Weeks    Status New    Target Date 09/17/22      OT LONG TERM GOAL #2   Title Scar tissue improve for pt to tolerate scar massage, different textures to facilitate increase AROM at wrist flexion, ext and touching palm with digits    Baseline Sterri srtrips removed today - applied one again - cannot initate scar massage yet- - decrease wrist and digits AROM    Time 4    Period Weeks    Status New    Target Date 09/03/22      OT LONG TERM GOAL #3   Title L Grip and prehension strenght incease to more than 60% compare to R hand for pt to carry more than 5 lbs, turn doorknob, cook using hand more than 50%    Baseline 3 1/2 wks s/p    Time 8    Period Weeks    Status New    Target Date 10/01/22      OT LONG TERM GOAL #4   Title L wrist and forearm strength increase to WNL for pt to push and pull heavy door, push up and turn doorknob and key without increase symptoms    Baseline 3 1/2 wks s/p - no strength yet    Time 8    Period Weeks     Status New    Target Date 10/01/22  Plan - 09/02/22 1959     Clinical Impression Statement Pt is about 7 wks s/p L ORIF distal radius fx - surgery by Dr [name] on 07/12/22. Pt  return this date with decrease pain and edema in L hand - increase wrist and digits AROM compare to last week. Pt used Benik neoprene and wrist support on and off since then in combination with isotoner glove. Pt to cont with contrast and adressing MC flexion during composite fist.  Pain increase with [place] /PROM with MC flexion 4th and 5th the worse. Was able to do CPM for wrist extention this date  again increase to 40-45 degrees. Pt to focus on scar massage and AAROM wrist in all planes. Scar still adhere and limiting composite fist and wrist extention. Done kinesiotape this date again and pt to wear for 24-48hrs if no issues.   Pt had an ortho appt  last week - Xray report indicates wrist is healing, fracture is stable and volar plate intact. Did initiate 16oz hammer holding close to head for sup /pro, RD, UD painfree 10 reps - 2 x day -  Pt can benefit from continued skilled OT services to decrease edema ,pain and scar tissue to increase ROM and strength in L hand and wrist to return to prior level of function.    OT Occupational Profile and History Problem Focused Assessment - Including review of records relating to presenting problem    Occupational performance deficits (Please refer to evaluation for details): ADL's;IADL's;Leisure;Social Participation;Work;Play    Body Structure / Function / Physical Skills ADL;IADL;Strength;Pain;Edema;Sensation;Dexterity;Decreased knowledge of precautions;Flexibility;ROM;UE functional use;Scar mobility;FMC    Rehab Potential Good    Clinical Decision Making Limited treatment options, no task modification necessary    Comorbidities Affecting Occupational Performance: None    Modification or Assistance to Complete Evaluation  No modification of tasks or assist  necessary to complete eval    OT Frequency 2x / week    OT Duration 8 weeks    OT Treatment/Interventions Self-care/ADL training;Contrast Bath;DME and/or AE instruction;Manual Therapy;Passive range of motion;Scar mobilization;Fluidtherapy;Paraffin;Splinting;Patient/family education;Therapeutic exercise    Consulted and Agree with Plan of Care Patient             Patient will benefit from skilled therapeutic intervention in order to improve the following deficits and impairments:   Body Structure / Function / Physical Skills: ADL, IADL, Strength, Pain, Edema, Sensation, Dexterity, Decreased knowledge of precautions, Flexibility, ROM, UE functional use, Scar mobility, FMC       Visit Diagnosis: Pain in left hand  Pain in left wrist  Stiffness of left wrist, not elsewhere classified  Stiffness of left hand, not elsewhere classified  Muscle weakness (generalized)  Scar condition and fibrosis of skin    Problem List There are no problems to display for this patient.  Sakina Briones Oneita Jolly, OTR/L, CLT  Kais Monje, OT 09/02/2022, 8:00 PM  [facility] [address] [place], [address] Phone: [phone]   Fax:  [phone]  Name: Emmelynn Beecher MRN: [id number] Date of Birth: 1960-12-03

## 2022-09-05 ENCOUNTER — Ambulatory Visit: Payer: Worker's Compensation | Admitting: Occupational Therapy

## 2022-09-05 ENCOUNTER — Encounter: Payer: Self-pay | Admitting: Occupational Therapy

## 2022-09-05 DIAGNOSIS — M25632 Stiffness of left wrist, not elsewhere classified: Secondary | ICD-10-CM

## 2022-09-05 DIAGNOSIS — M25532 Pain in left wrist: Secondary | ICD-10-CM

## 2022-09-05 DIAGNOSIS — M79642 Pain in left hand: Secondary | ICD-10-CM | POA: Diagnosis not present

## 2022-09-05 DIAGNOSIS — L905 Scar conditions and fibrosis of skin: Secondary | ICD-10-CM

## 2022-09-05 DIAGNOSIS — M25642 Stiffness of left hand, not elsewhere classified: Secondary | ICD-10-CM

## 2022-09-05 DIAGNOSIS — M6281 Muscle weakness (generalized): Secondary | ICD-10-CM

## 2022-09-05 NOTE — Therapy (Signed)
[facility] [address] [place], [address] Phone: [phone]   Fax:  [phone]  Occupational Therapy Treatment  Patient Details  Name: Carolyn Hogan MRN: [id number] Date of Birth: [birth year] Referring Provider (OT): [place]   Encounter Date: 09/05/2022   OT End of Session - 09/08/22 1950     Visit Number 9    Number of Visits 18    Date for OT Re-Evaluation 10/01/22    OT Start Time 1200    OT Stop Time 1246    OT Time Calculation (min) 46 min    Activity Tolerance Patient tolerated treatment well    Behavior During Therapy [facility] for tasks assessed/performed             Past Medical History:  Diagnosis Date   Hypercholesteremia     Past Surgical History:  Procedure Laterality Date   ABDOMINAL HYSTERECTOMY     OPEN REDUCTION INTERNAL FIXATION (ORIF) DISTAL RADIAL FRACTURE Left 07/12/2022   Procedure: OPEN REDUCTION INTERNAL FIXATION (ORIF) DISTAL RADIUS FRACTURE;  Surgeon: [name], MD;  Location: [place];  Service: Orthopedics;  Laterality: Left;    There were no vitals filed for this visit.   Subjective Assessment - 09/08/22 1949     Subjective  Pt reports 2/10 pain at rest, 6/10 with movement on the left.  Has some soreness after therapy sessions.  Some pain and stiffness in upper arm towards shoulder especially when lying in bed can be 8/10.  She feels like when she uses the hammer for exercises it bothers her more.  1# weight at home, 10 reps at a time and using her benik support while doing the exercises.    Pertinent History Ortho NOTE 07/26/22 -ORIF, left distal radius - 07/09/22, By Dr. Deon Hogan is a 62 y.o. female who presents today status post left distal radius ORIF by Dr. [name] on 07/12/2022. Patient has finished the Norco, has taken occasional ibuprofen only 3 tablets over the last couple of weeks. She is having some pain and swelling throughout the  hand and wrist but overall symptoms are improving. She has performed very little exercises on her own, she is guarded secondary to discomfort. She denies any numbness or tingling. Sutures removed. REFER to OT    Patient Stated Goals Want to get my L hand and wrist better to use it at home and work    Currently in Pain? Yes    Pain Score 6     Pain Location Hand    Pain Orientation Left    Pain Descriptors / Indicators Aching;Tightness    Pain Type Surgical pain    Pain Onset More than a month ago    Pain Frequency Intermittent            Interpreter present during session this date   Fluidotherapy: Pt seen for use of fluidotherapy to left hand for 10 mins while performing active range of motion to hand while in fluido to decrease pain, increase tissue mobility and increase ROM.    Manual Therapy: Pt seen this date for soft tissue mobilization, performed metacarpal spreads as well as thumb webspace prior to range of motion.Use of graston tool nr 2 for sweeping and brushing over palm and digits and forearm.  Scar massage followed by use of xtractor- manual by OT performed on scar.  xtractor with AROM for fisting and wrist extension to decrease adhesion limiting pt's wrist ext  and composite flexion At end of session, Kinesiotaping applied 30 % stretch placed parallel to scar, 2 strips, 3 strips placed across with 100% pull.  She reported some itching the day before around taped area, she stated it was more after her shower and getting strips wet.  Recommended she take off taping sooner and attempt to dry area well after bathing and washing hands to decrease irritation.   Therapeutic Exercise: Active range of motion for palmar /radial abduction of thumb 10 reps Thumb opposition alternating digits 8 reps and now sliding down 4th and 5th digits with promotion of thumb RA in between, verbal cues and demo   Tendon glides with active assisted range of motion focusing on MC flexion and then  composite flexion maintaining MC flexion. PROM composite fist after AAROM tendon glides   AAROM over edge of table for wrist flexion, ext, RD, UD 10 reps Prayer stretch 10 reps  CPM for wrist extention 2x 200 sec done on BTE- increase to 45-50 degrees    Did not perform hammer this date, recommend she try exercise with her weight which has a shorter lever arm and will monitor pain responses.        OT Education - 09/08/22 1950     Education Details progress and reinforce HEP    Person(s) Educated Patient    Methods Explanation;Demonstration;Tactile cues;Verbal cues;Handout    Comprehension Verbal cues required;Returned demonstration;Verbalized understanding              OT Short Term Goals - 08/06/22 1749       OT SHORT TERM GOAL #1   Title Pt to be ind in HEP to decrease edema , pain and increase ROM in digits to touch palm and opposition to all digits    Baseline increase edema, sterristrips in place -pain 4-10/10 in hand and wrist with AROM - limited in flexion of thumb and digits    Time 3    Period Weeks    Status New    Target Date 08/27/22               OT Long Term Goals - 08/06/22 1751       OT LONG TERM GOAL #1   Title L wrist AROM increase to [facility] for pt to use hand to turn doorknob and use more than 50% in bathing and dressing    Baseline Wrist AROM ext 10, flexion 50; sup 50 and RD 8, UD 20- not using hand    Time 6    Period Weeks    Status New    Target Date 09/17/22      OT LONG TERM GOAL #2   Title Scar tissue improve for pt to tolerate scar massage, different textures to facilitate increase AROM at wrist flexion, ext and touching palm with digits    Baseline Sterri srtrips removed today - applied one again - cannot initate scar massage yet- - decrease wrist and digits AROM    Time 4    Period Weeks    Status New    Target Date 09/03/22      OT LONG TERM GOAL #3   Title L Grip and prehension strenght incease to more than 60% compare to R  hand for pt to carry more than 5 lbs, turn doorknob, cook using hand more than 50%    Baseline 3 1/2 wks s/p    Time 8    Period Weeks    Status New    Target  Date 10/01/22      OT LONG TERM GOAL #4   Title L wrist and forearm strength increase to WNL for pt to push and pull heavy door, push up and turn doorknob and key without increase symptoms    Baseline 3 1/2 wks s/p - no strength yet    Time 8    Period Weeks    Status New    Target Date 10/01/22                   Plan - 09/08/22 1950     Clinical Impression Statement Pt is about 7.5 wks s/p L ORIF distal radius fx - surgery by Dr [name] on 07/12/22. Pt  return this date with decrease pain and edema in L hand - increase wrist and digits AROM compare to last week. Pt used Benik neoprene and wrist support on and off since then in combination with isotoner glove. Pt to cont with contrast and adressing MC flexion during composite fist. Pt to continue to focus on scar massage and AAROM wrist in all planes. Scar still adhering and limiting composite fist and wrist extension. Application of kinesiotape this date again and pt to wear for 24-48 hours, pt reported some itching and irritation after last application however she kept it on for several days, recommend she remove sooner and also dry area well after bathing and hand hygiene.     Pt had an ortho appt  last week - Xray report indicates wrist is healing, fracture is stable and volar plate intact.  Pt indicates since intiating hammer exercise she has some increased pain.  Discussed form and number of repetitions, did not perform in session this date, recommend she use her 1# weight at home with shorter lever arm and will monitor pain next session.  Pt can benefit from continued skilled OT services to decrease edema ,pain and scar tissue to increase ROM and strength in L hand and wrist to return to prior level of function.    OT Occupational Profile and History Problem Focused Assessment -  Including review of records relating to presenting problem    Occupational performance deficits (Please refer to evaluation for details): ADL's;IADL's;Leisure;Social Participation;Work;Play    Body Structure / Function / Physical Skills ADL;IADL;Strength;Pain;Edema;Sensation;Dexterity;Decreased knowledge of precautions;Flexibility;ROM;UE functional use;Scar mobility;FMC    Rehab Potential Good    Clinical Decision Making Limited treatment options, no task modification necessary    Comorbidities Affecting Occupational Performance: None    Modification or Assistance to Complete Evaluation  No modification of tasks or assist necessary to complete eval    OT Frequency 2x / week    OT Duration 8 weeks    OT Treatment/Interventions Self-care/ADL training;Contrast Bath;DME and/or AE instruction;Manual Therapy;Passive range of motion;Scar mobilization;Fluidtherapy;Paraffin;Splinting;Patient/family education;Therapeutic exercise    Consulted and Agree with Plan of Care Patient             Patient will benefit from skilled therapeutic intervention in order to improve the following deficits and impairments:   Body Structure / Function / Physical Skills: ADL, IADL, Strength, Pain, Edema, Sensation, Dexterity, Decreased knowledge of precautions, Flexibility, ROM, UE functional use, Scar mobility, FMC       Visit Diagnosis: Pain in left hand  Pain in left wrist  Stiffness of left wrist, not elsewhere classified  Stiffness of left hand, not elsewhere classified  Muscle weakness (generalized)  Scar condition and fibrosis of skin    Problem List There are no problems to display for this  patient.  Everli Rother Oneita Jolly, OTR/L, CLT  Devyne Hauger, OT 09/08/2022, 8:01 PM  [facility] [address] [place], [address] Phone: [phone]   Fax:  [phone]  Name: Deandre Brownfield MRN: [id number] Date of Birth: 1961/04/18

## 2022-09-09 ENCOUNTER — Encounter: Payer: Self-pay | Admitting: Occupational Therapy

## 2022-09-09 ENCOUNTER — Ambulatory Visit: Payer: Worker's Compensation | Attending: Orthopedic Surgery | Admitting: Occupational Therapy

## 2022-09-09 DIAGNOSIS — M25642 Stiffness of left hand, not elsewhere classified: Secondary | ICD-10-CM | POA: Diagnosis present

## 2022-09-09 DIAGNOSIS — M79642 Pain in left hand: Secondary | ICD-10-CM | POA: Diagnosis not present

## 2022-09-09 DIAGNOSIS — M25632 Stiffness of left wrist, not elsewhere classified: Secondary | ICD-10-CM | POA: Insufficient documentation

## 2022-09-09 DIAGNOSIS — M25532 Pain in left wrist: Secondary | ICD-10-CM | POA: Insufficient documentation

## 2022-09-09 DIAGNOSIS — M6281 Muscle weakness (generalized): Secondary | ICD-10-CM | POA: Diagnosis present

## 2022-09-09 DIAGNOSIS — L905 Scar conditions and fibrosis of skin: Secondary | ICD-10-CM | POA: Diagnosis present

## 2022-09-12 ENCOUNTER — Ambulatory Visit: Payer: Worker's Compensation | Admitting: Occupational Therapy

## 2022-09-12 DIAGNOSIS — L905 Scar conditions and fibrosis of skin: Secondary | ICD-10-CM

## 2022-09-12 DIAGNOSIS — M79642 Pain in left hand: Secondary | ICD-10-CM

## 2022-09-12 DIAGNOSIS — M25632 Stiffness of left wrist, not elsewhere classified: Secondary | ICD-10-CM

## 2022-09-12 DIAGNOSIS — M25642 Stiffness of left hand, not elsewhere classified: Secondary | ICD-10-CM

## 2022-09-12 DIAGNOSIS — M6281 Muscle weakness (generalized): Secondary | ICD-10-CM

## 2022-09-12 DIAGNOSIS — M25532 Pain in left wrist: Secondary | ICD-10-CM

## 2022-09-12 NOTE — Therapy (Signed)
[facility] [address], [place], [address] Phone: [phone]   Fax:  [phone]  Occupational Therapy Treatment  Patient Details  Name: Carolyn Hogan MRN: [id number] Date of Birth: 1960/09/26 Referring Provider (OT): [place]   Encounter Date: 09/12/2022   OT End of Session - 09/12/22 1938     Visit Number 11    Number of Visits 18    Date for OT Re-Evaluation 10/01/22    OT Start Time 1202    OT Stop Time 1300    OT Time Calculation (min) 58 min    Activity Tolerance Patient tolerated treatment well    Behavior During Therapy [facility] for tasks assessed/performed             Past Medical History:  Diagnosis Date   Hypercholesteremia     Past Surgical History:  Procedure Laterality Date   ABDOMINAL HYSTERECTOMY     OPEN REDUCTION INTERNAL FIXATION (ORIF) DISTAL RADIAL FRACTURE Left 07/12/2022   Procedure: OPEN REDUCTION INTERNAL FIXATION (ORIF) DISTAL RADIUS FRACTURE;  Surgeon: [name], MD;  Location: [facility];  Service: Orthopedics;  Laterality: Left;    There were no vitals filed for this visit.   Subjective Assessment - 09/12/22 1935     Subjective  I can tell I have more strength but still pain over the wrist and back of hand -cannot make fist - when I do warm water my fingers are better but when I bend them using my other hand - it hurts - my niece helped me the other day    Pertinent History Ortho NOTE 07/26/22 -ORIF, left distal radius - 07/09/22, By Dr. [name] is a 62 y.o. female who presents today status post left distal radius ORIF by Dr. [name] on 07/12/2022. Patient has finished the Norco, has taken occasional ibuprofen only 3 tablets over the last couple of weeks. She is having some pain and swelling throughout the hand and wrist but overall symptoms are improving. She has performed very little exercises on her own, she is guarded secondary to discomfort.  She denies any numbness or tingling. Sutures removed. REFER to OT    Patient Stated Goals Want to get my L hand and wrist better to use it at home and work    Currently in Pain? Yes    Pain Score 4     Pain Location --   wrist and hand - dorsal with flexion   Pain Orientation Left    Pain Descriptors / Indicators Tightness;Aching    Pain Type Surgical pain                [facility] OT Assessment - 09/12/22 0001       AROM   Left Forearm Pronation 90 Degrees    Left Forearm Supination 85 Degrees    Left Wrist Extension 28 Degrees   in session 45   Left Wrist Flexion 48 Degrees   60 in session   Left Wrist Radial Deviation 22 Degrees    Left Wrist Ulnar Deviation 25 Degrees      Strength   Right Hand Grip (lbs) 43    Right Hand Lateral Pinch 12 lbs    Right Hand 3 Point Pinch 10 lbs    Left Hand Grip (lbs) 5    Left Hand Lateral Pinch 5 lbs    Left Hand 3 Point Pinch 5 lbs      Left Hand AROM  L Index  MCP 0-90 70 Degrees    L Index PIP 0-100 100 Degrees    L Long  MCP 0-90 75 Degrees    L Long PIP 0-100 100 Degrees    L Ring  MCP 0-90 75 Degrees    L Ring PIP 0-100 100 Degrees    L Little  MCP 0-90 60 Degrees    L Little PIP 0-100 90 Degrees             Assess AROM in  L wrist and digits -patient continues to be limited mostly in [facility] flexion as well as composite with pain over dorsal hand.  Reinforced again with patient after multiple times in the past to focus on [place] flexion with composite instead of intrinsic a fist. Wrist extension and flexion less than last time.  Reinforced with patient again to focusing on range of motion prior to strengthening and will decrease her pain as her range of motion improved.      Patient to use moist heat or also discussed with her use of paraffin wax.   OT Treatments/Exercises (OP) - 09/12/22 0001       LUE Paraffin   Number Minutes Paraffin 8 Minutes    LUE Paraffin Location Hand;Wrist    Comments hnad in fist -stretch  wrist flexion, ext 30 sec at time            Soft tissue massage done to metacarpals spreads as well as webspace prior to range of motion. Assess scar improving scar adhesion and tenderness but still present and limiting wrist extension.  As well as composite flexion. Patient to continue with scar massage and [employer] scar pad. PROM and prolonged flexion stretch to MCPs followed by composite flexion. Placed on hold in composite fist.  Limited by pain mostly over fourth and fifth digit. Reviewed with patient to do at home. Prior to light blue putty add for home program for gripping as well as lateral and 3-point pinch. Patient to do pain-free 1 set of 12-15 2 times a day Patient reported doing 2 pound weight for supination pronation as well as radial ulnar deviation After recommended in the past 1 pound weight. Reinforced with patient again to focus on wrist flexion extension stretches and reviewed with her again As well as continuing supination pronation and ulnar radial deviation with 1 pound weight. 12-15 reps 2 times a day. Patient report continued shoulder discomfort and pain-patient to discuss with MD next appointment tomorrow.        OT Education - 09/12/22 1937     Education Details progress and reinforce HEP    Person(s) Educated Patient    Methods Explanation;Demonstration;Tactile cues;Verbal cues;Handout    Comprehension Verbal cues required;Returned demonstration;Verbalized understanding              OT Short Term Goals - 09/11/22 0840       OT SHORT TERM GOAL #1   Title Pt to be ind in HEP to decrease edema , pain and increase ROM in digits to touch palm and opposition to all digits    Baseline increase edema, sterristrips in place -pain 4-10/10 in hand and wrist with AROM - limited in flexion of thumb and digits  10th visit:  she still has pain with exercise and movement up to 6/10    Time 3    Period Weeks    Status On-going    Target Date 09/24/22  OT Long Term Goals - 09/11/22 0840       OT LONG TERM GOAL #1   Title L wrist AROM increase to [facility] for pt to use hand to turn doorknob and use more than 50% in bathing and dressing    Baseline Wrist AROM ext 10, flexion 50; sup 50 and RD 8, UD 20- not using hand, 10th visit:  Independent with bathing and dressing and using at least 50%.    Time 6    Period Weeks    Status Achieved    Target Date 09/17/22      OT LONG TERM GOAL #2   Title Scar tissue improve for pt to tolerate scar massage, different textures to facilitate increase AROM at wrist flexion, ext and touching palm with digits    Baseline Sterri srtrips removed today - applied one again - cannot initate scar massage yet- - decrease wrist and digits AROM, 10th visit:  improved toleration of scar massage, still working to increase AROM    Time 4    Period Weeks    Status On-going    Target Date 10/01/22      OT LONG TERM GOAL #3   Title L Grip and prehension strenght incease to more than 60% compare to R hand for pt to carry more than 5 lbs, turn doorknob, cook using hand more than 50%    Baseline 10th visit:  unable to carry objects greater than 5#, using hand for basic tasks as an assist and support but not able to perform normal cooking tasks and family assisting.    Time 8    Period Weeks    Status On-going    Target Date 10/01/22      OT LONG TERM GOAL #4   Title L wrist and forearm strength increase to WNL for pt to push and pull heavy door, push up and turn doorknob and key without increase symptoms    Baseline 3 1/2 wks s/p - no strength yet.  10th visit:  unable to use hand to push open heavy door.    Time 8    Period Weeks    Status On-going    Target Date 10/01/22                   Plan - 09/12/22 1939     Clinical Impression Statement Pt is about 8 wks s/p L ORIF distal radius fx - surgery by Dr [name] on 07/12/22. Pt limited since eval greatly by edema, pain and stiffness in  digits flexion and wrist ext more than flexion. Pt Scar adhere to and had tenderness but improving. Recommend for her still to use Benik  neoprene and isotoner glove as needed. Pt arrive this date with cont increase stiffness mostly in [place] flexion and composite - multiple times reviewed MC PROM and composite fist but still doing mostly intrinsic fist . AGAIN reviewed with her today and add light blue putty for grip and prehension strength . Also recommender for her to use  1# weight at home for wrist but she done 2 lbs for sup, pro and RD, UD. Pt showed increase AROM in session after review of HEP for PROM wrist flexion, ext and digits MC and composite flexion.  She cont to show some improvements in active ROM /strength but remains limited with wrist movement, composite fisting and finger ROM.  Strength remains limited with comparision from left to right.   Pt continues to benefit from continued skilled OT  services to decrease edema ,pain and scar tissue to increase ROM and strength in L hand and wrist to return to prior level of function. SHe do report some shoulder pain since fall - pt to talk to MD about it.    OT Occupational Profile and History Problem Focused Assessment - Including review of records relating to presenting problem    Occupational performance deficits (Please refer to evaluation for details): ADL's;IADL's;Leisure;Social Participation;Work;Play    Body Structure / Function / Physical Skills ADL;IADL;Strength;Pain;Edema;Sensation;Dexterity;Decreased knowledge of precautions;Flexibility;ROM;UE functional use;Scar mobility;FMC    Rehab Potential Good    Clinical Decision Making Limited treatment options, no task modification necessary    Comorbidities Affecting Occupational Performance: None    Modification or Assistance to Complete Evaluation  No modification of tasks or assist necessary to complete eval    OT Frequency 2x / week    OT Duration 8 weeks    OT Treatment/Interventions  Self-care/ADL training;Contrast Bath;DME and/or AE instruction;Manual Therapy;Passive range of motion;Scar mobilization;Fluidtherapy;Paraffin;Splinting;Patient/family education;Therapeutic exercise    Consulted and Agree with Plan of Care Patient             Patient will benefit from skilled therapeutic intervention in order to improve the following deficits and impairments:   Body Structure / Function / Physical Skills: ADL, IADL, Strength, Pain, Edema, Sensation, Dexterity, Decreased knowledge of precautions, Flexibility, ROM, UE functional use, Scar mobility, FMC       Visit Diagnosis: Pain in left hand  Pain in left wrist  Stiffness of left wrist, not elsewhere classified  Stiffness of left hand, not elsewhere classified  Muscle weakness (generalized)  Scar condition and fibrosis of skin    Problem List There are no problems to display for this patient.   Rosalyn Gess, OTR/L,CLT 09/12/2022, 7:46 PM  [facility] [address], [place], [address] Phone: [phone]   Fax:  [phone]  Name: Carolyn Hogan MRN: [id number] Date of Birth: 09/27/1960

## 2022-09-12 NOTE — Therapy (Signed)
[facility] [address] [place], [address] Phone: [phone]   Fax:  [phone]  Occupational Therapy Treatment/Progress Update Reporting period from 08/06/2022 to 09/09/2022  Patient Details  Name: Rosaly Merkley MRN: [id number] Date of Birth: December 11, 1960 Referring Provider (OT): [place]   Encounter Date: 09/09/2022   OT End of Session - 09/12/22 0833     Visit Number 10    Number of Visits 18    Date for OT Re-Evaluation 10/01/22    OT Start Time 1515    OT Stop Time 1600    OT Time Calculation (min) 45 min    Activity Tolerance Patient tolerated treatment well    Behavior During Therapy [facility] for tasks assessed/performed             Past Medical History:  Diagnosis Date   Hypercholesteremia     Past Surgical History:  Procedure Laterality Date   ABDOMINAL HYSTERECTOMY     OPEN REDUCTION INTERNAL FIXATION (ORIF) DISTAL RADIAL FRACTURE Left 07/12/2022   Procedure: OPEN REDUCTION INTERNAL FIXATION (ORIF) DISTAL RADIUS FRACTURE;  Surgeon: [name], MD;  Location: [place];  Service: Orthopedics;  Laterality: Left;    There were no vitals filed for this visit.   Subjective Assessment - 09/12/22 0831     Subjective  Pt reports 6/10 pain on left, interpreter Teddy in session today.  She reports she did not perform hammer exercise and pain is still present.    Pertinent History Ortho NOTE 07/26/22 -ORIF, left distal radius - 07/09/22, By Dr. [name] is a 62 y.o. female who presents today status post left distal radius ORIF by Dr. [name] on 07/12/2022. Patient has finished the Norco, has taken occasional ibuprofen only 3 tablets over the last couple of weeks. She is having some pain and swelling throughout the hand and wrist but overall symptoms are improving. She has performed very little exercises on her own, she is guarded secondary to discomfort. She denies any numbness  or tingling. Sutures removed. REFER to OT    Patient Stated Goals Want to get my L hand and wrist better to use it at home and work    Currently in Pain? Yes    Pain Score 6     Pain Location Hand    Pain Orientation Left    Pain Descriptors / Indicators Aching;Tightness    Pain Type Surgical pain    Pain Onset More than a month ago    Pain Frequency Intermittent            Interpreter Barth Kirks present this date and is scheduled to be here on Thursday as well.   Fluidotherapy: Pt seen for use of fluidotherapy to left hand for 10 mins while performing active range of motion to hand while in fluido to decrease pain, increase tissue mobility and increase ROM.    Manual Therapy: Following fluidotherapy, pt seen this date for soft tissue mobilization, performed metacarpal spreads as well as thumb webspace prior to range of motion.Use of graston tool nr 2 for sweeping and brushing over palm and digits and forearm.  Scar massage followed by use of xtractor- manual by OT performed on scar.  xtractor with AROM for fisting and wrist extension to decrease adhesion limiting pt's wrist ext and composite flexion   Therapeutic Exercise:  Pt seen for reassessment of hand function, see flow sheet below. With composite fisting, IF and MF touches palm, RF  is 1 cm from palm, SF is 2 cm from palm.  Full opposition to small finger with small pain in small finger noted.  Active range of motion for palmar /radial abduction of thumb 10 reps Thumb opposition alternating digits 8 reps and now sliding down 4th and 5th digits with promotion of thumb RA in between, verbal cues and demo   Tendon glides with active assisted range of motion focusing on MC flexion and then composite flexion maintaining MC flexion. PROM composite fist after AAROM tendon glides AAROM over edge of table for wrist flexion, ext, RD, UD 10 reps Prayer stretch 10 reps Table slides in standing for wrist and finger extension   CPM for  wrist extension 2x 200 sec done on BTE- increase to 45-50 degrees      [facility] OT Assessment - 09/11/22 1102       Assessment   Medical Diagnosis L ORIF distal radius fx    Referring Provider (OT) Arvella Nigh PA    Onset Date/Surgical Date 07/12/22    Hand Dominance Right      Precautions   Required Braces or Orthoses --   Brace on at all times up and about     Home  Environment   Lives With Family      Prior Function   Vocation Full time employment    Leisure textile, house work, cooking      AROM   Overall AROM Comments Report some stiffness and pain in shoulder and med elbow    Left Forearm Pronation 90 Degrees    Left Forearm Supination 76 Degrees    Left Wrist Extension 35 Degrees   40 after CPM   Left Wrist Flexion 60 Degrees    Left Wrist Radial Deviation 22 Degrees    Left Wrist Ulnar Deviation 28 Degrees      Strength   Right Hand Grip (lbs) 43    Right Hand Lateral Pinch 12 lbs    Right Hand 3 Point Pinch 10 lbs    Left Hand Grip (lbs) 2    Left Hand Lateral Pinch 5 lbs    Left Hand 3 Point Pinch 2 lbs      Left Hand AROM   L Index  MCP 0-90 60 Degrees    L Index PIP 0-100 95 Degrees    L Long  MCP 0-90 75 Degrees    L Long PIP 0-100 95 Degrees    L Ring  MCP 0-90 60 Degrees    L Ring PIP 0-100 90 Degrees    L Little  MCP 0-90 60 Degrees    L Little PIP 0-100 90 Degrees               OT Education - 09/12/22 0833     Education Details progress and reinforce HEP    Person(s) Educated Patient    Methods Explanation;Demonstration;Tactile cues;Verbal cues;Handout    Comprehension Verbal cues required;Returned demonstration;Verbalized understanding              OT Short Term Goals - 09/11/22 0840       OT SHORT TERM GOAL #1   Title Pt to be ind in HEP to decrease edema , pain and increase ROM in digits to touch palm and opposition to all digits    Baseline increase edema, sterristrips in place -pain 4-10/10 in hand and wrist with AROM - limited in  flexion of thumb and digits  10th visit:  she still has pain with exercise and  movement up to 6/10    Time 3    Period Weeks    Status On-going    Target Date 09/24/22               OT Long Term Goals - 09/11/22 0840       OT LONG TERM GOAL #1   Title L wrist AROM increase to [facility] for pt to use hand to turn doorknob and use more than 50% in bathing and dressing    Baseline Wrist AROM ext 10, flexion 50; sup 50 and RD 8, UD 20- not using hand, 10th visit:  Independent with bathing and dressing and using at least 50%.    Time 6    Period Weeks    Status Achieved    Target Date 09/17/22      OT LONG TERM GOAL #2   Title Scar tissue improve for pt to tolerate scar massage, different textures to facilitate increase AROM at wrist flexion, ext and touching palm with digits    Baseline Sterri srtrips removed today - applied one again - cannot initate scar massage yet- - decrease wrist and digits AROM, 10th visit:  improved toleration of scar massage, still working to increase AROM    Time 4    Period Weeks    Status On-going    Target Date 10/01/22      OT LONG TERM GOAL #3   Title L Grip and prehension strenght incease to more than 60% compare to R hand for pt to carry more than 5 lbs, turn doorknob, cook using hand more than 50%    Baseline 10th visit:  unable to carry objects greater than 5#, using hand for basic tasks as an assist and support but not able to perform normal cooking tasks and family assisting.    Time 8    Period Weeks    Status On-going    Target Date 10/01/22      OT LONG TERM GOAL #4   Title L wrist and forearm strength increase to WNL for pt to push and pull heavy door, push up and turn doorknob and key without increase symptoms    Baseline 3 1/2 wks s/p - no strength yet.  10th visit:  unable to use hand to push open heavy door.    Time 8    Period Weeks    Status On-going    Target Date 10/01/22                   Plan - 09/12/22 0834      Clinical Impression Statement Pt is about 8 wks s/p L ORIF distal radius fx - surgery by Dr [name] on 07/12/22.  Pt used Benik neoprene and wrist support on and off in combination with isotoner glove. Pt to cont with contrast and adressing MC flexion during composite fist. Pt to continue to focus on scar massage and AAROM wrist in all planes. Scar still adhering and limiting composite fist and wrist extension.   Pt had an ortho appt - Xray report indicates wrist is healing, fracture is stable and volar plate intact.  Pt indicates since intiating hammer exercise she has some increased pain.  Discussed form and number of repetitions, did not perform in session, recommend she use her 1# weight at home with shorter lever arm and will monitor pain next session. Progress update this session with measurements taken as noted in flowsheet, pt continues to complain of pain in hand with exercise,  6/10 overall.  She demonstrates some improvements in active ROM but remains limited with wrist movement, composite fisting and finger ROM.  Strength remains limited with comparision from left to right.  Attempted to modify hammer activity to see if her pain decreased however she reports it is unchanged from last couple sessions.  Pt continues to benefit from continued skilled OT services to decrease edema ,pain and scar tissue to increase ROM and strength in L hand and wrist to return to prior level of function.    OT Occupational Profile and History Problem Focused Assessment - Including review of records relating to presenting problem    Occupational performance deficits (Please refer to evaluation for details): ADL's;IADL's;Leisure;Social Participation;Work;Play    Body Structure / Function / Physical Skills ADL;IADL;Strength;Pain;Edema;Sensation;Dexterity;Decreased knowledge of precautions;Flexibility;ROM;UE functional use;Scar mobility;FMC    Rehab Potential Good    Clinical Decision Making Limited treatment options, no task  modification necessary    Comorbidities Affecting Occupational Performance: None    Modification or Assistance to Complete Evaluation  No modification of tasks or assist necessary to complete eval    OT Frequency 2x / week    OT Duration 8 weeks    OT Treatment/Interventions Self-care/ADL training;Contrast Bath;DME and/or AE instruction;Manual Therapy;Passive range of motion;Scar mobilization;Fluidtherapy;Paraffin;Splinting;Patient/family education;Therapeutic exercise    Consulted and Agree with Plan of Care Patient             Patient will benefit from skilled therapeutic intervention in order to improve the following deficits and impairments:   Body Structure / Function / Physical Skills: ADL, IADL, Strength, Pain, Edema, Sensation, Dexterity, Decreased knowledge of precautions, Flexibility, ROM, UE functional use, Scar mobility, FMC       Visit Diagnosis: Pain in left hand  Pain in left wrist  Stiffness of left wrist, not elsewhere classified  Stiffness of left hand, not elsewhere classified  Muscle weakness (generalized)  Scar condition and fibrosis of skin    Problem List There are no problems to display for this patient.  Lj Miyamoto Oneita Jolly, OTR/L, CLT  Katesha Eichel, OT 09/12/2022, 11:15 AM  [facility] [address] [place], [address] Phone: [phone]   Fax:  [phone]  Name: Britzy Longenecker MRN: [id number] Date of Birth: 01-Sep-1960

## 2022-09-16 ENCOUNTER — Encounter: Payer: Self-pay | Admitting: Student

## 2022-09-16 ENCOUNTER — Other Ambulatory Visit: Payer: Self-pay | Admitting: Student

## 2022-09-16 ENCOUNTER — Ambulatory Visit: Payer: Worker's Compensation | Admitting: Occupational Therapy

## 2022-09-16 DIAGNOSIS — M25532 Pain in left wrist: Secondary | ICD-10-CM

## 2022-09-16 DIAGNOSIS — M25642 Stiffness of left hand, not elsewhere classified: Secondary | ICD-10-CM

## 2022-09-16 DIAGNOSIS — M79642 Pain in left hand: Secondary | ICD-10-CM | POA: Diagnosis not present

## 2022-09-16 DIAGNOSIS — L905 Scar conditions and fibrosis of skin: Secondary | ICD-10-CM

## 2022-09-16 DIAGNOSIS — M25632 Stiffness of left wrist, not elsewhere classified: Secondary | ICD-10-CM

## 2022-09-16 DIAGNOSIS — Z1231 Encounter for screening mammogram for malignant neoplasm of breast: Secondary | ICD-10-CM

## 2022-09-16 NOTE — Therapy (Signed)
[facility] [address] [place], [place], [id number] Phone: [phone]   Fax:  [phone]  Occupational Therapy Treatment  Patient Details  Name: Carolyn Hogan MRN: [id number] Date of Birth: 1960-08-03 Referring Provider (OT): [place]   Encounter Date: 09/16/2022   OT End of Session - 09/16/22 1552     Visit Number 12    Number of Visits 18    Date for OT Re-Evaluation 10/01/22    OT Start Time 1430    OT Stop Time 1520    OT Time Calculation (min) 50 min    Activity Tolerance Patient tolerated treatment well    Behavior During Therapy [facility] for tasks assessed/performed             Past Medical History:  Diagnosis Date   Hypercholesteremia     Past Surgical History:  Procedure Laterality Date   ABDOMINAL HYSTERECTOMY     OPEN REDUCTION INTERNAL FIXATION (ORIF) DISTAL RADIAL FRACTURE Left 07/12/2022   Procedure: OPEN REDUCTION INTERNAL FIXATION (ORIF) DISTAL RADIUS FRACTURE;  Surgeon: Kennedy BuckerMenz, Michael, MD;  [facility];  Service: Orthopedics;  Laterality: Left;    There were no vitals filed for this visit.   Subjective Assessment - 09/16/22 1550     Subjective  I been working my fingers and can make fist -but then it gets stiff again - pinkie hurting - I try and use it    Pertinent History Ortho NOTE 07/26/22 -ORIF, left distal radius - 07/09/22, By Dr. Caesar BookmanMenz  Carolyn Hogan is a 62 y.o. female who presents today status post left distal radius ORIF by Dr. Kennedy BuckerMichael Menz on 07/12/2022. Patient has finished the Norco, has taken occasional ibuprofen only 3 tablets over the last couple of weeks. She is having some pain and swelling throughout the hand and wrist but overall symptoms are improving. She has performed very little exercises on her own, she is guarded secondary to discomfort. She denies any numbness or tingling. Sutures removed. REFER to OT    Patient Stated Goals Want to get my L hand and  wrist better to use it at home and work    Currently in Pain? Yes    Pain Score 4     Pain Location --   hand   Pain Orientation Left    Pain Descriptors / Indicators Aching;Tightness    Pain Type Surgical pain    Pain Onset More than a month ago    Pain Frequency Intermittent                [facility] OT Assessment - 09/16/22 0001       AROM   Left Forearm Supination 85 Degrees    Left Wrist Extension 32 Degrees   50 in session   Left Wrist Flexion 60 Degrees    Left Wrist Radial Deviation 22 Degrees    Left Wrist Ulnar Deviation 27 Degrees      Strength   Right Hand Grip (lbs) 43    Right Hand Lateral Pinch 12 lbs    Right Hand 3 Point Pinch 10 lbs    Left Hand Grip (lbs) 12    Left Hand Lateral Pinch 7 lbs    Left Hand 3 Point Pinch 6 lbs      Left Hand AROM   L Index  MCP 0-90 75 Degrees    L Index PIP 0-100 100 Degrees    L Long  MCP 0-90 85 Degrees  L Long PIP 0-100 100 Degrees    L Ring  MCP 0-90 85 Degrees    L Ring PIP 0-100 100 Degrees    L Little  MCP 0-90 80 Degrees    L Little PIP 0-100 100 Degrees              Patient arrive with reports of able to touch palm but then it is get stiff again.  Encourage patient to continue with composite flexion and using her hand during the day. Patient able to hold a fork in session and cut body simulating cutting food.   Grip and prehension assist as well as wrist flexion extension.       OT Treatments/Exercises (OP) - 09/16/22 0001       LUE Paraffin   Number Minutes Paraffin 8 Minutes    LUE Paraffin Location Hand;Wrist    Comments wrist ext and flexion stretch            Soft tissue massage done to metacarpals spreads as well as webspace prior to range of motion. Assess scar improving scar adhesion and tenderness but still present and limiting wrist extension.  As well as composite flexion. Patient to continue with scar massage and C[employer] -Care scar pad.  PROM and prolonged flexion stretch to  MCPs followed by composite flexion to continue at home with Patient to do composite flexion prior to  light blue putty for gripping.   Upgrade patient to teal medium putty for lateral and 3-point pinch. Pain-free Patient to do pain-free 1 set of 12-15 2 times a day Patient reported doing 2 pound weight for supination pronation as well as radial ulnar deviation After recommended in the past 1 pound weight. Reinforced with patient again to focus on wrist flexion extension stretches and reviewed with her again This time 2 minutes each 2 times prior to Table slides for wrist extension 20 reps added to home program patient tolerating well  As well as continuing supination pronation and ulnar radial deviation with 1 pound weight. 12-15 reps 2 times a day.         OT Education - 09/16/22 1551     Education Details progress and reinforce HEP    Person(s) Educated Patient    Methods Explanation;Demonstration;Tactile cues;Verbal cues;Handout    Comprehension Verbal cues required;Returned demonstration;Verbalized understanding              OT Short Term Goals - 09/11/22 0840       OT SHORT TERM GOAL #1   Title Pt to be ind in HEP to decrease edema , pain and increase ROM in digits to touch palm and opposition to all digits    Baseline increase edema, sterristrips in place -pain 4-10/10 in hand and wrist with AROM - limited in flexion of thumb and digits  10th visit:  she still has pain with exercise and movement up to 6/10    Time 3    Period Weeks    Status On-going    Target Date 09/24/22               OT Long Term Goals - 09/11/22 0840       OT LONG TERM GOAL #1   Title L wrist AROM increase to [facility] for pt to use hand to turn doorknob and use more than 50% in bathing and dressing    Baseline Wrist AROM ext 10, flexion 50; sup 50 and RD 8, UD 20- not using hand, 10th visit:  Independent with bathing and dressing and using at least 50%.    Time 6    Period Weeks     Status Achieved    Target Date 09/17/22      OT LONG TERM GOAL #2   Title Scar tissue improve for pt to tolerate scar massage, different textures to facilitate increase AROM at wrist flexion, ext and touching palm with digits    Baseline Sterri srtrips removed today - applied one again - cannot initate scar massage yet- - decrease wrist and digits AROM, 10th visit:  improved toleration of scar massage, still working to increase AROM    Time 4    Period Weeks    Status On-going    Target Date 10/01/22      OT LONG TERM GOAL #3   Title L Grip and prehension strenght incease to more than 60% compare to R hand for pt to carry more than 5 lbs, turn doorknob, cook using hand more than 50%    Baseline 10th visit:  unable to carry objects greater than 5#, using hand for basic tasks as an assist and support but not able to perform normal cooking tasks and family assisting.    Time 8    Period Weeks    Status On-going    Target Date 10/01/22      OT LONG TERM GOAL #4   Title L wrist and forearm strength increase to WNL for pt to push and pull heavy door, push up and turn doorknob and key without increase symptoms    Baseline 3 1/2 wks s/p - no strength yet.  10th visit:  unable to use hand to push open heavy door.    Time 8    Period Weeks    Status On-going    Target Date 10/01/22                   Plan - 09/16/22 1552     Clinical Impression Statement Pt is about 8 1/2 wks s/p L ORIF distal radius fx - surgery by Dr Rosita Kea on 07/12/22. Pt limited since eval greatly by edema, pain and stiffness in digits flexion and wrist ext more than flexion. Pt Scar adhere to and had tenderness but improving. Recommend for her still to use Benik  neoprene and isotoner glove as needed. Pt arrive this date with increase digits flexion - able to touch palm but report it gets stiff after copule of hours - but trying to use it more. Wrist flexion and ext increase little but in session with paraffin able to  increase this date -reinforce HEP for wrist flexion, ext stretches. Grip increase this session compare to last week. Reinforce again to do at home [facility] PROM and composite fist. Cont with light blue putty for grip and upgrade to teal med putty for prehension strength . Also recommender for her to use  1# weight at home for wrist but she done 2 lbs for sup, pro and RD, UD. Pt showed increase AROM in session after review of HEP for PROM wrist flexion, ext. She cont to show some improvements in active ROM /strength but remains limited with wrist movement, composite fisting and finger ROM.  Strength remains limited with comparision from left to right.   Pt continues to benefit from continued skilled OT services to decrease edema ,pain and scar tissue to increase ROM and strength in L hand and wrist to return to prior level of function. SHe do report some shoulder  pain since fall - pt to talk to MD about it.    OT Occupational Profile and History Problem Focused Assessment - Including review of records relating to presenting problem    Occupational performance deficits (Please refer to evaluation for details): ADL's;IADL's;Leisure;Social Participation;Work;Play    Body Structure / Function / Physical Skills ADL;IADL;Strength;Pain;Edema;Sensation;Dexterity;Decreased knowledge of precautions;Flexibility;ROM;UE functional use;Scar mobility;FMC    Rehab Potential Good    Clinical Decision Making Limited treatment options, no task modification necessary    Comorbidities Affecting Occupational Performance: None    Modification or Assistance to Complete Evaluation  No modification of tasks or assist necessary to complete eval    OT Frequency 2x / week    OT Duration 8 weeks    OT Treatment/Interventions Self-care/ADL training;Contrast Bath;DME and/or AE instruction;Manual Therapy;Passive range of motion;Scar mobilization;Fluidtherapy;Paraffin;Splinting;Patient/family education;Therapeutic exercise    Consulted and Agree  with Plan of Care Patient             Patient will benefit from skilled therapeutic intervention in order to improve the following deficits and impairments:   Body Structure / Function / Physical Skills: ADL, IADL, Strength, Pain, Edema, Sensation, Dexterity, Decreased knowledge of precautions, Flexibility, ROM, UE functional use, Scar mobility, FMC       Visit Diagnosis: Pain in left hand  Pain in left wrist  Stiffness of left wrist, not elsewhere classified  Stiffness of left hand, not elsewhere classified  Scar condition and fibrosis of skin    Problem List There are no problems to display for this patient.   Oletta Cohn, OTR/L,CLT 09/16/2022, 3:56 PM  [facility] [address] [place], [address] Phone: [phone]   Fax:  [phone]  Name: Lysa Marcello MRN: [id number] Date of Birth: 1960-06-18

## 2022-09-19 ENCOUNTER — Ambulatory Visit: Payer: Worker's Compensation | Admitting: Occupational Therapy

## 2022-09-19 DIAGNOSIS — M79642 Pain in left hand: Secondary | ICD-10-CM

## 2022-09-19 DIAGNOSIS — M25532 Pain in left wrist: Secondary | ICD-10-CM

## 2022-09-19 DIAGNOSIS — M6281 Muscle weakness (generalized): Secondary | ICD-10-CM

## 2022-09-19 DIAGNOSIS — L905 Scar conditions and fibrosis of skin: Secondary | ICD-10-CM

## 2022-09-19 DIAGNOSIS — M25642 Stiffness of left hand, not elsewhere classified: Secondary | ICD-10-CM

## 2022-09-19 DIAGNOSIS — M25632 Stiffness of left wrist, not elsewhere classified: Secondary | ICD-10-CM

## 2022-09-19 NOTE — Therapy (Signed)
[facility] [address], [place], [address] Phone: [phone]   Fax:  [phone]  Occupational Therapy Treatment  Patient Details  Name: Carolyn Hogan MRN: [id number] Date of Birth: [birth year] Referring Provider (OT): [place]   Encounter Date: 09/19/2022   OT End of Session - 09/19/22 1547     Visit Number 13    Number of Visits 18    Date for OT Re-Evaluation 10/01/22    OT Start Time 1402    OT Stop Time 1445    OT Time Calculation (min) 43 min    Activity Tolerance Patient tolerated treatment well    Behavior During Therapy [facility] for tasks assessed/performed             Past Medical History:  Diagnosis Date   Hypercholesteremia     Past Surgical History:  Procedure Laterality Date   ABDOMINAL HYSTERECTOMY     OPEN REDUCTION INTERNAL FIXATION (ORIF) DISTAL RADIAL FRACTURE Left 07/12/2022   Procedure: OPEN REDUCTION INTERNAL FIXATION (ORIF) DISTAL RADIUS FRACTURE;  Surgeon: Kennedy Bucker, MD;  Location: [facility];  Service: Orthopedics;  Laterality: Left;    There were no vitals filed for this visit.   Subjective Assessment - 09/19/22 1546     Subjective  My hand continues to be stiff in the morning and then also if I stop working it during the day.  I forgot my putty last time here, sorry.  I did do my exercises and stretches for my wrist but not in the morning. Just 1 x day    Pertinent History Ortho NOTE 07/26/22 -ORIF, left distal radius - 07/09/22, By Dr. [name] is a 62 y.o. female who presents today status post left distal radius ORIF by Dr. Kennedy Bucker on 07/12/2022. Patient has finished the Norco, has taken occasional ibuprofen only 3 tablets over the last couple of weeks. She is having some pain and swelling throughout the hand and wrist but overall symptoms are improving. She has performed very little exercises on her own, she is guarded secondary to discomfort.  She denies any numbness or tingling. Sutures removed. REFER to OT    Patient Stated Goals Want to get my L hand and wrist better to use it at home and work    Currently in Pain? Yes    Pain Score 4     Pain Location --   dorsal hand fist, and scar with scar massage   Pain Orientation Left                          OT Treatments/Exercises (OP) - 09/19/22 0001       LUE Paraffin   Number Minutes Paraffin 8 Minutes    LUE Paraffin Location Hand;Wrist    Comments wrist flexion, ext 2 min stretch x 2 each             Soft tissue massage done to metacarpals spreads as well as webspace prior to range of motion. Focus this date on scar mobilization using Coban for traction in all directions.  And patient educated on doing it at home with precautions reviewed.   Also done extractor with digit flexion as well as wrist flexion extension  Applied Kinesiotape with 2 parallel at 20% and across scar to at 100% pull to facilitate scar mobilization for the next 48 hours.    PROM and prolonged flexion stretch to MCPs  followed by composite flexion  Cont at home  Patient to do composite flexion prior to  light blue putty for gripping.   Upgrade patient to teal medium putty last visit but patient forgot it.  For lateral and 3-point pinch. Pain-free Patient to do pain-free 1 set of 12-15 2 times a day Patient reported doing 2 pound weight for supination pronation as well as radial ulnar deviation After recommended in the past 1 pound weight.  focus on wrist flexion extension stretches and reviewed with her again and reinforced importance This time 2 minutes each 2 times prior to Table slides for wrist extension 20 reps reviewed. Attended 1 pound weight placed on hold for wrist extension but patient not ready.                 OT Education - 09/19/22 1547     Education Details progress and reinforce HEP    Person(s) Educated Patient    Methods  Explanation;Demonstration;Tactile cues;Verbal cues;Handout    Comprehension Verbal cues required;Returned demonstration;Verbalized understanding              OT Short Term Goals - 09/11/22 0840       OT SHORT TERM GOAL #1   Title Pt to be ind in HEP to decrease edema , pain and increase ROM in digits to touch palm and opposition to all digits    Baseline increase edema, sterristrips in place -pain 4-10/10 in hand and wrist with AROM - limited in flexion of thumb and digits  10th visit:  she still has pain with exercise and movement up to 6/10    Time 3    Period Weeks    Status On-going    Target Date 09/24/22               OT Long Term Goals - 09/11/22 0840       OT LONG TERM GOAL #1   Title [facility] to use hand to turn doorknob and use more than 50% in bathing and dressing    Baseline Wrist AROM ext 10, flexion 50; sup 50 and RD 8, UD 20- not using hand, 10th visit:  Independent with bathing and dressing and using at least 50%.    Time 6    Period Weeks    Status Achieved    Target Date 09/17/22      OT LONG TERM GOAL #2   Title Scar tissue improve for pt to tolerate scar massage, different textures to facilitate increase AROM at wrist flexion, ext and touching palm with digits    Baseline Sterri srtrips removed today - applied one again - cannot initate scar massage yet- - decrease wrist and digits AROM, 10th visit:  improved toleration of scar massage, still working to increase AROM    Time 4    Period Weeks    Status On-going    Target Date 10/01/22      OT LONG TERM GOAL #3   Title L Grip and prehension strenght incease to more than 60% compare to R hand for pt to carry more than 5 lbs, turn doorknob, cook using hand more than 50%    Baseline 10th visit:  unable to carry objects greater than 5#, using hand for basic tasks as an assist and support but not able to perform normal cooking tasks and family assisting.    Time 8    Period  Weeks    Status On-going  Target Date 10/01/22      OT LONG TERM GOAL #4   Title L wrist and forearm strength increase to WNL for pt to push and pull heavy door, push up and turn doorknob and key without increase symptoms    Baseline 3 1/2 wks s/p - no strength yet.  10th visit:  unable to use hand to push open heavy door.    Time 8    Period Weeks    Status On-going    Target Date 10/01/22                   Plan - 09/19/22 1549     Clinical Impression Statement Pt is about 10 wks s/p L ORIF distal radius fx - surgery by Dr Rosita Kea on 07/12/22. Pt limited since eval greatly by edema, pain and stiffness in digits flexion and wrist ext more than flexion. Pt scar adhere and tender - focus this date on scar mobs and reviewed again with pt - scar mobsin combination with digits flexoin and wrist extention. Since last visit pt showed increase digits flexion - able to touch palm but report it gets stiff inbetween and in the am.  Wrist flexion and ext increasing slowly - review again pt to do prolonged wrist ext and flexion stretch with table slides for wrist exention. Reinforce again to do at home [facility] PROM and composite fist. Cont with light blue putty for grip and upgrade to teal med putty for prehension strength . Can cont with 2 lbs for sup/pro and UD and RD- but hold off on wrist flexion and extention because of stiffness. Pt show increase wrist flexion and exention in session.  Strength remains limited with comparision from left to right.   Pt continues to benefit from continued skilled OT services to decrease edema ,pain and scar tissue to increase ROM and strength in L hand and wrist to return to prior level of function. SHe do report some shoulder pain since fall - pt to talk to MD about it.    OT Occupational Profile and History Problem Focused Assessment - Including review of records relating to presenting problem    Occupational performance deficits (Please refer to evaluation for details):  ADL's;IADL's;Leisure;Social Participation;Work;Play    Body Structure / Function / Physical Skills ADL;IADL;Strength;Pain;Edema;Sensation;Dexterity;Decreased knowledge of precautions;Flexibility;ROM;UE functional use;Scar mobility;FMC    Rehab Potential Good    Clinical Decision Making Limited treatment options, no task modification necessary    Comorbidities Affecting Occupational Performance: None    Modification or Assistance to Complete Evaluation  No modification of tasks or assist necessary to complete eval    OT Frequency 2x / week    OT Duration 8 weeks    OT Treatment/Interventions Self-care/ADL training;Contrast Bath;DME and/or AE instruction;Manual Therapy;Passive range of motion;Scar mobilization;Fluidtherapy;Paraffin;Splinting;Patient/family education;Therapeutic exercise    Consulted and Agree with Plan of Care Patient             Patient will benefit from skilled therapeutic intervention in order to improve the following deficits and impairments:   Body Structure / Function / Physical Skills: ADL, IADL, Strength, Pain, Edema, Sensation, Dexterity, Decreased knowledge of precautions, Flexibility, ROM, UE functional use, Scar mobility, FMC       Visit Diagnosis: Pain in left hand  Pain in left wrist  Stiffness of left wrist, not elsewhere classified  Stiffness of left hand, not elsewhere classified  Scar condition and fibrosis of skin  Muscle weakness (generalized)    Problem List There are no  problems to display for this patient.   Oletta Cohn, OTR/L,CLT 09/19/2022, 3:55 PM  [facility] [address], [place], [address] Phone: [phone]   Fax:  [phone]  Name: Carolyn Hogan MRN: [id number] Date of Birth: Jan 08, 1961

## 2022-09-23 ENCOUNTER — Ambulatory Visit: Payer: Worker's Compensation | Admitting: Occupational Therapy

## 2022-09-23 DIAGNOSIS — M79642 Pain in left hand: Secondary | ICD-10-CM

## 2022-09-23 DIAGNOSIS — L905 Scar conditions and fibrosis of skin: Secondary | ICD-10-CM

## 2022-09-23 DIAGNOSIS — M25642 Stiffness of left hand, not elsewhere classified: Secondary | ICD-10-CM

## 2022-09-23 DIAGNOSIS — M6281 Muscle weakness (generalized): Secondary | ICD-10-CM

## 2022-09-23 DIAGNOSIS — M25532 Pain in left wrist: Secondary | ICD-10-CM

## 2022-09-23 DIAGNOSIS — M25632 Stiffness of left wrist, not elsewhere classified: Secondary | ICD-10-CM

## 2022-09-23 NOTE — Therapy (Signed)
[facility] [address], [place], [address] Phone: [phone]   Fax:  [phone]  Occupational Therapy Treatment  Patient Details  Name: Carolyn Hogan MRN: [id number] Date of Birth: 1961-03-01 Referring Provider (OT): [place]   Encounter Date: 09/23/2022   OT End of Session - 09/23/22 1517     Visit Number 14    Number of Visits 18    Date for OT Re-Evaluation 10/01/22    OT Start Time 1517    OT Stop Time 1616    OT Time Calculation (min) 59 min    Activity Tolerance Patient tolerated treatment well    Behavior During Therapy [facility] for tasks assessed/performed             Past Medical History:  Diagnosis Date   Hypercholesteremia     Past Surgical History:  Procedure Laterality Date   ABDOMINAL HYSTERECTOMY     OPEN REDUCTION INTERNAL FIXATION (ORIF) DISTAL RADIAL FRACTURE Left 07/12/2022   Procedure: OPEN REDUCTION INTERNAL FIXATION (ORIF) DISTAL RADIUS FRACTURE;  Surgeon: Kennedy Bucker, MD;  Location: [facility];  Service: Orthopedics;  Laterality: Left;    There were no vitals filed for this visit.   Subjective Assessment - 09/23/22 1516     Subjective  My whole arm from hand to elbow to my upper arm is hurting - I did tell the Dr - he said if my hand and wrist gets better my arm will get better to- I work my wrist and hand but then it gets stiff again -  I did my putty a lot    Pertinent History Ortho NOTE 07/26/22 -ORIF, left distal radius - 07/09/22, By Dr. [name] is a 62 y.o. female who presents today status post left distal radius ORIF by Dr. Kennedy Bucker on 07/12/2022. Patient has finished the Norco, has taken occasional ibuprofen only 3 tablets over the last couple of weeks. She is having some pain and swelling throughout the hand and wrist but overall symptoms are improving. She has performed very little exercises on her own, she is guarded secondary to discomfort.  She denies any numbness or tingling. Sutures removed. REFER to OT    Patient Stated Goals Want to get my L hand and wrist better to use it at home and work    Currently in Pain? Yes    Pain Score 6     Pain Location --   upper arm   Pain Orientation Left    Pain Descriptors / Indicators Aching;Tightness    Pain Type Acute pain    Pain Onset 1 to 4 weeks ago                [facility] OT Assessment - 09/23/22 0001       AROM   Left Forearm Pronation 90 Degrees    Left Forearm Supination 90 Degrees    Left Wrist Extension 40 Degrees    Left Wrist Flexion 65 Degrees    Left Wrist Radial Deviation 22 Degrees    Left Wrist Ulnar Deviation 27 Degrees      Strength   Left Hand Grip (lbs) 15    Left Hand Lateral Pinch 7 lbs    Left Hand 3 Point Pinch 7 lbs      Left Hand AROM   L Index  MCP 0-90 80 Degrees    L Index PIP 0-100 100 Degrees    L Long  MCP  0-90 85 Degrees    L Long PIP 0-100 100 Degrees    L Ring  MCP 0-90 90 Degrees    L Ring PIP 0-100 100 Degrees    L Little  MCP 0-90 80 Degrees    L Little PIP 0-100 100 Degrees             Patient does show with increased wrist flexion and extension compared to last week. While waiting in the waiting room patient worked in composite flexion full fist able to touch palm when coming in. Patient do report stiffness in between exercise sessions.  In the morning. Patient report increased soreness over dorsal wrist forearm to elbow as well as upper arm. Upon assessment it appears patient was doing pinching into the putty with arm out to the side and the table. Reminded patient again to do putty in front of her on elbow height. Done contrast to upper arm on the left while paraffin to the left hand and wrist prior to soft tissue. Upon assessment patient pain with endrange shoulder abduction and flexion over lateral deltoid as well as tightness tenderness over upper traps. Patient reports pain can be 6-8/10 at endrange flexion  extension of shoulder. Even with active assisted range of motion on the wall. Done some Graston tool #2 sweeping over upper arm  As well as stretches for upper trap for cervical lateral flexion 10 reps. Reviewed with patient pulleys for shoulder flexion and abduction 20 reps pain-free twice a day after contrast.         OT Treatments/Exercises (OP) - 09/23/22 0001       LUE Paraffin   Number Minutes Paraffin 8 Minutes    LUE Paraffin Location Hand;Wrist    Comments prior to flexion, ext wrist and shoulder -      LUE Contrast Bath   Time 8 minutes    Comments shoulder and upper arm - prior to soft issue            Soft tissue massage done to metacarpals spreads as well as webspace prior to range of motion.     PROM and prolonged flexion stretch to  composite flexion  Cont at home  And have green firm putty for patient to add to her teal medium putty  - grip and ,lateral and 3-point pinch. 12 reps Pain-free Cont 2 pound weight for supination pronation as well as radial ulnar deviation Make sure to do only wrist and forearm - no elbow and shoulder    focus on wrist flexion extension stretches and reviewed with her again and reinforced importance This time 2 minutes each 2 times prior to Table slides for wrist extension 20 reps reviewed.             OT Education - 09/23/22 1517     Education Details progress and reinforce HEP    Person(s) Educated Patient    Methods Explanation;Demonstration;Tactile cues;Verbal cues;Handout    Comprehension Verbal cues required;Returned demonstration;Verbalized understanding              OT Short Term Goals - 09/11/22 0840       OT SHORT TERM GOAL #1   Title Pt to be ind in HEP to decrease edema , pain and increase ROM in digits to touch palm and opposition to all digits    Baseline increase edema, sterristrips in place -pain 4-10/10 in hand and wrist with AROM - limited in flexion of thumb and digits  10th visit:  she still has pain with exercise and movement up to 6/10    Time 3    Period Weeks    Status On-going    Target Date 09/24/22               OT Long Term Goals - 09/11/22 0840       OT LONG TERM GOAL #1   Title L wrist AROM increase to [facility] for pt to use hand to turn doorknob and use more than 50% in bathing and dressing    Baseline Wrist AROM ext 10, flexion 50; sup 50 and RD 8, UD 20- not using hand, 10th visit:  Independent with bathing and dressing and using at least 50%.    Time 6    Period Weeks    Status Achieved    Target Date 09/17/22      OT LONG TERM GOAL #2   Title Scar tissue improve for pt to tolerate scar massage, different textures to facilitate increase AROM at wrist flexion, ext and touching palm with digits    Baseline Sterri srtrips removed today - applied one again - cannot initate scar massage yet- - decrease wrist and digits AROM, 10th visit:  improved toleration of scar massage, still working to increase AROM    Time 4    Period Weeks    Status On-going    Target Date 10/01/22      OT LONG TERM GOAL #3   Title L Grip and prehension strenght incease to more than 60% compare to R hand for pt to carry more than 5 lbs, turn doorknob, cook using hand more than 50%    Baseline 10th visit:  unable to carry objects greater than 5#, using hand for basic tasks as an assist and support but not able to perform normal cooking tasks and family assisting.    Time 8    Period Weeks    Status On-going    Target Date 10/01/22      OT LONG TERM GOAL #4   Title L wrist and forearm strength increase to WNL for pt to push and pull heavy door, push up and turn doorknob and key without increase symptoms    Baseline 3 1/2 wks s/p - no strength yet.  10th visit:  unable to use hand to push open heavy door.    Time 8    Period Weeks    Status On-going    Target Date 10/01/22                   Plan - 09/23/22 1517     Clinical Impression Statement Pt is about 10  1/2wks s/p L ORIF distal radius fx - surgery by Dr Rosita Kea on 07/12/22. Pt limited since eval greatly by edema, pain and stiffness in digits flexion and wrist ext more than flexion. Pt scar adhere and tender - focus  last time on scar mobs  and  scar mobsi n combination with digits flexion and wrist extention.  Pt is making progress the last 2 sessions in composite digits flexion and wrist flexion, ext -  able to touch palm but report it gets stiff inbetween and in the am.  Wrist flexion and ext increasing slowly - pt to cont with  prolonged wrist ext and flexion stretch with table slides for wrist exention. Reinforce again to do at home [facility] PROM and composite fist. Can cont with 2 lbs for sup/pro and UD and RD- but hold off on  wrist flexion and extention because of stiffness.  This date pt report increase pain in dorsal forearm to elbow to upper arm - upper traps tight and tight/tender over lateral upper arm.  Pulley's add for HEP for shoulder flexion and ABD painfree.  Strength remains limited with comparision from left to right.   Pt continues to benefit from continued skilled OT services to decrease edema ,pain and scar tissue to increase ROM and strength in L hand and wrist to return to prior level of function. SHe do report some shoulder pain since fall - pt to talk to MD about it.    OT Occupational Profile and History Problem Focused Assessment - Including review of records relating to presenting problem    Occupational performance deficits (Please refer to evaluation for details): ADL's;IADL's;Leisure;Social Participation;Work;Play    Body Structure / Function / Physical Skills ADL;IADL;Strength;Pain;Edema;Sensation;Dexterity;Decreased knowledge of precautions;Flexibility;ROM;UE functional use;Scar mobility;FMC    Rehab Potential Good    Clinical Decision Making Limited treatment options, no task modification necessary    Comorbidities Affecting Occupational Performance: None    Modification or Assistance  to Complete Evaluation  No modification of tasks or assist necessary to complete eval    OT Frequency 2x / week    OT Duration 8 weeks    OT Treatment/Interventions Self-care/ADL training;Contrast Bath;DME and/or AE instruction;Manual Therapy;Passive range of motion;Scar mobilization;Fluidtherapy;Paraffin;Splinting;Patient/family education;Therapeutic exercise    Consulted and Agree with Plan of Care Patient             Patient will benefit from skilled therapeutic intervention in order to improve the following deficits and impairments:   Body Structure / Function / Physical Skills: ADL, IADL, Strength, Pain, Edema, Sensation, Dexterity, Decreased knowledge of precautions, Flexibility, ROM, UE functional use, Scar mobility, FMC       Visit Diagnosis: Pain in left hand  Pain in left wrist  Stiffness of left wrist, not elsewhere classified  Stiffness of left hand, not elsewhere classified  Scar condition and fibrosis of skin  Muscle weakness (generalized)    Problem List There are no problems to display for this patient.   Oletta Cohn, OTR/L,CLT 09/23/2022, 4:32 PM  [facility] [address] [place], [address] Phone: [phone]   Fax:  [phone]  Name: Aveen Stansel MRN: [id number] Date of Birth: 26-May-1961

## 2022-09-26 ENCOUNTER — Ambulatory Visit: Payer: Worker's Compensation | Admitting: Occupational Therapy

## 2022-09-26 DIAGNOSIS — L905 Scar conditions and fibrosis of skin: Secondary | ICD-10-CM

## 2022-09-26 DIAGNOSIS — M25642 Stiffness of left hand, not elsewhere classified: Secondary | ICD-10-CM

## 2022-09-26 DIAGNOSIS — M25532 Pain in left wrist: Secondary | ICD-10-CM

## 2022-09-26 DIAGNOSIS — M25632 Stiffness of left wrist, not elsewhere classified: Secondary | ICD-10-CM

## 2022-09-26 DIAGNOSIS — M6281 Muscle weakness (generalized): Secondary | ICD-10-CM

## 2022-09-26 DIAGNOSIS — M79642 Pain in left hand: Secondary | ICD-10-CM

## 2022-09-26 NOTE — Therapy (Signed)
[facility] [address], [place], [address] Phone: [phone]   Fax:  [phone]  Occupational Therapy Treatment  Patient Details  Name: Carolyn Hogan MRN: [id number] Date of Birth: 1960/12/20 Referring Provider (OT): [place]   Encounter Date: 09/26/2022   OT End of Session - 09/26/22 1955     Visit Number 15    Number of Visits 18    Date for OT Re-Evaluation 10/01/22    OT Start Time 1446    OT Stop Time 1531    OT Time Calculation (min) 45 min    Activity Tolerance Patient tolerated treatment well    Behavior During Therapy [facility] for tasks assessed/performed             Past Medical History:  Diagnosis Date   Hypercholesteremia     Past Surgical History:  Procedure Laterality Date   ABDOMINAL HYSTERECTOMY     OPEN REDUCTION INTERNAL FIXATION (ORIF) DISTAL RADIAL FRACTURE Left 07/12/2022   Procedure: OPEN REDUCTION INTERNAL FIXATION (ORIF) DISTAL RADIUS FRACTURE;  Surgeon: Kennedy Bucker, MD;  Location: [facility];  Service: Orthopedics;  Laterality: Left;    There were no vitals filed for this visit.   Subjective Assessment - 09/26/22 1952     Subjective  My hand still hurts and my wrist still hurts.  And upper arm.  The other day I did my wrist exercises my wrist stays inflamed.  Did some hot and cold.  I did do the pulleys for my shoulders.    Pertinent History Ortho NOTE 07/26/22 -ORIF, left distal radius - 07/09/22, By Dr. [name] is a 62 y.o. female who presents today status post left distal radius ORIF by Dr. Kennedy Bucker on 07/12/2022. Patient has finished the Norco, has taken occasional ibuprofen only 3 tablets over the last couple of weeks. She is having some pain and swelling throughout the hand and wrist but overall symptoms are improving. She has performed very little exercises on her own, she is guarded secondary to discomfort. She denies any numbness or tingling.  Sutures removed. REFER to OT    Patient Stated Goals Want to get my L hand and wrist better to use it at home and work    Currently in Pain? Yes    Pain Score 2     Pain Location --   dorsal wrist and upper arm   Pain Orientation Left    Pain Descriptors / Indicators Aching;Tightness;Tender    Pain Type Acute pain    Pain Onset More than a month ago    Pain Frequency Intermittent                          OT Treatments/Exercises (OP) - 09/26/22 0001       LUE Paraffin   Number Minutes Paraffin 8 Minutes    LUE Paraffin Location Hand;Wrist    Comments coban flexion wrap to all digits prior to ROM adn CPM           Soft tissue massage done to metacarpals spreads as well as webspace prior to range of motion.    PROM and prolonged flexion stretch to MCPs followed by composite flexion  Pt can also do over thigh  Observe pt cont to do PROM to intrinsic fist during session -instead or pressure over MC and composite  Cont at home  Patient to do composite flexion prior to teal  putty  teal medium putty  lateral and 3-point pinch to but to do on her other hand - and not on table and ABD shoulder causing pain in upper arm  Pain-free Patient to do pain-free 1 set of 12-15 2 times a day Patient reported doing 2 pound weight for supination pronation as well as radial ulnar deviation    Pt to cont with wrist flexion /extension stretches - prolonged stretches   2 minutes each 2 times prior to Table slides for wrist extension 20 reps reviewed. This date done CPM for wrist extention 2 x 200 sec  And flexion 1 x 200 sec Pt report feel pull mostly over dorsal wrist  Done 1 lbs 12 reps - place and hold for extention and relax into flexion             OT Education - 09/26/22 1954     Education Details progress and reinforce HEP    Person(s) Educated Patient    Methods Explanation;Demonstration;Tactile cues;Verbal cues;Handout    Comprehension Verbal cues  required;Returned demonstration;Verbalized understanding              OT Short Term Goals - 09/11/22 0840       OT SHORT TERM GOAL #1   Title Pt to be ind in HEP to decrease edema , pain and increase ROM in digits to touch palm and opposition to all digits    Baseline increase edema, sterristrips in place -pain 4-10/10 in hand and wrist with AROM - limited in flexion of thumb and digits  10th visit:  she still has pain with exercise and movement up to 6/10    Time 3    Period Weeks    Status On-going    Target Date 09/24/22               OT Long Term Goals - 09/11/22 0840       OT LONG TERM GOAL #1   Title L wrist AROM increase to [facility] for pt to use hand to turn doorknob and use more than 50% in bathing and dressing    Baseline Wrist AROM ext 10, flexion 50; sup 50 and RD 8, UD 20- not using hand, 10th visit:  Independent with bathing and dressing and using at least 50%.    Time 6    Period Weeks    Status Achieved    Target Date 09/17/22      OT LONG TERM GOAL #2   Title Scar tissue improve for pt to tolerate scar massage, different textures to facilitate increase AROM at wrist flexion, ext and touching palm with digits    Baseline Sterri srtrips removed today - applied one again - cannot initate scar massage yet- - decrease wrist and digits AROM, 10th visit:  improved toleration of scar massage, still working to increase AROM    Time 4    Period Weeks    Status On-going    Target Date 10/01/22      OT LONG TERM GOAL #3   Title L Grip and prehension strenght incease to more than 60% compare to R hand for pt to carry more than 5 lbs, turn doorknob, cook using hand more than 50%    Baseline 10th visit:  unable to carry objects greater than 5#, using hand for basic tasks as an assist and support but not able to perform normal cooking tasks and family assisting.    Time 8    Period Weeks  Status On-going    Target Date 10/01/22      OT LONG TERM GOAL #4   Title L  wrist and forearm strength increase to WNL for pt to push and pull heavy door, push up and turn doorknob and key without increase symptoms    Baseline 3 1/2 wks s/p - no strength yet.  10th visit:  unable to use hand to push open heavy door.    Time 8    Period Weeks    Status On-going    Target Date 10/01/22                   Plan - 09/26/22 1955     Clinical Impression Statement Pt is about 11 wks s/p L ORIF distal radius fx - surgery by Dr Rosita Kea on 07/12/22. Pt limited since eval greatly by edema, pain and stiffness in digits flexion and wrist ext more than flexion. Pt scar adhere and tender - focus the last few session on scar tissue, composite digits flexion and wrist flexion and extention.  Pt is making slow but steady progress the last 3 sessions -but cont to need review of same HEP to focus and pain over dorsal hand and wrist. Also report increase discomfort and pain but not consistant in lateral upper arm. Initiate this date again CPM for wrist extention and flexion - pt did show increase of 5  degrees in flexion and ext today coming in- and in session but discomfort mostly over dorsal hand and wrist. Strength  remains limited with comparision from left to right.   Pt continues to benefit from continued skilled OT services to decrease edema ,pain and scar tissue to increase ROM and strength in L hand and wrist to return to prior level of function. SHe do report some shoulder pain since fall - pt to talk to MD about it.    OT Occupational Profile and History Problem Focused Assessment - Including review of records relating to presenting problem    Occupational performance deficits (Please refer to evaluation for details): ADL's;IADL's;Leisure;Social Participation;Work;Play    Body Structure / Function / Physical Skills ADL;IADL;Strength;Pain;Edema;Sensation;Dexterity;Decreased knowledge of precautions;Flexibility;ROM;UE functional use;Scar mobility;FMC    Rehab Potential Good     Clinical Decision Making Limited treatment options, no task modification necessary    Comorbidities Affecting Occupational Performance: None    Modification or Assistance to Complete Evaluation  No modification of tasks or assist necessary to complete eval    OT Frequency 2x / week    OT Duration 8 weeks    OT Treatment/Interventions Self-care/ADL training;Contrast Bath;DME and/or AE instruction;Manual Therapy;Passive range of motion;Scar mobilization;Fluidtherapy;Paraffin;Splinting;Patient/family education;Therapeutic exercise    Consulted and Agree with Plan of Care Patient             Patient will benefit from skilled therapeutic intervention in order to improve the following deficits and impairments:   Body Structure / Function / Physical Skills: ADL, IADL, Strength, Pain, Edema, Sensation, Dexterity, Decreased knowledge of precautions, Flexibility, ROM, UE functional use, Scar mobility, FMC       Visit Diagnosis: Pain in left hand  Pain in left wrist  Stiffness of left wrist, not elsewhere classified  Stiffness of left hand, not elsewhere classified  Scar condition and fibrosis of skin  Muscle weakness (generalized)    Problem List There are no problems to display for this patient.   Oletta Cohn, OTR/L,CLT 09/26/2022, 8:01 PM  [facility] [address] [place], [place], [address] Phone: [phone]   Fax:  [phone]  Name: Shawnay Bramel MRN: [id number] Date of Birth: 11-01-1960

## 2022-09-30 ENCOUNTER — Ambulatory Visit: Payer: Worker's Compensation | Admitting: Occupational Therapy

## 2022-09-30 DIAGNOSIS — M25642 Stiffness of left hand, not elsewhere classified: Secondary | ICD-10-CM

## 2022-09-30 DIAGNOSIS — M79642 Pain in left hand: Secondary | ICD-10-CM | POA: Diagnosis not present

## 2022-09-30 DIAGNOSIS — L905 Scar conditions and fibrosis of skin: Secondary | ICD-10-CM

## 2022-09-30 DIAGNOSIS — M25532 Pain in left wrist: Secondary | ICD-10-CM

## 2022-09-30 DIAGNOSIS — M25632 Stiffness of left wrist, not elsewhere classified: Secondary | ICD-10-CM

## 2022-09-30 DIAGNOSIS — M6281 Muscle weakness (generalized): Secondary | ICD-10-CM

## 2022-10-04 ENCOUNTER — Ambulatory Visit: Payer: Worker's Compensation | Admitting: Occupational Therapy

## 2022-10-04 DIAGNOSIS — M25642 Stiffness of left hand, not elsewhere classified: Secondary | ICD-10-CM

## 2022-10-04 DIAGNOSIS — M25632 Stiffness of left wrist, not elsewhere classified: Secondary | ICD-10-CM

## 2022-10-04 DIAGNOSIS — L905 Scar conditions and fibrosis of skin: Secondary | ICD-10-CM

## 2022-10-04 DIAGNOSIS — M6281 Muscle weakness (generalized): Secondary | ICD-10-CM

## 2022-10-04 DIAGNOSIS — M79642 Pain in left hand: Secondary | ICD-10-CM

## 2022-10-07 ENCOUNTER — Ambulatory Visit: Payer: Worker's Compensation | Admitting: Occupational Therapy

## 2022-10-07 ENCOUNTER — Encounter: Payer: Self-pay | Admitting: Occupational Therapy

## 2022-10-07 DIAGNOSIS — M25632 Stiffness of left wrist, not elsewhere classified: Secondary | ICD-10-CM

## 2022-10-07 DIAGNOSIS — M6281 Muscle weakness (generalized): Secondary | ICD-10-CM

## 2022-10-07 DIAGNOSIS — L905 Scar conditions and fibrosis of skin: Secondary | ICD-10-CM

## 2022-10-07 DIAGNOSIS — M25642 Stiffness of left hand, not elsewhere classified: Secondary | ICD-10-CM

## 2022-10-07 DIAGNOSIS — M79642 Pain in left hand: Secondary | ICD-10-CM | POA: Diagnosis not present

## 2022-10-07 DIAGNOSIS — M25532 Pain in left wrist: Secondary | ICD-10-CM

## 2022-10-07 NOTE — Therapy (Signed)
[facility] [address], [place], [address] Phone: [phone]   Fax:  [phone]  Occupational Therapy Treatment  Patient Details  Name: Carolyn Hogan MRN: [id number] Date of Birth: 09/04/1960 Referring Provider (OT): [place]   Encounter Date: 10/04/2022   OT End of Session - 10/07/22 1255     Visit Number 17    Number of Visits 26    Date for OT Re-Evaluation 10/28/22    Authorization Type 18 authorized visits with worker's comp    OT Start Time 1051    OT Stop Time 1138    OT Time Calculation (min) 47 min    Activity Tolerance Patient tolerated treatment well    Behavior During Therapy [facility] for tasks assessed/performed             Past Medical History:  Diagnosis Date   Hypercholesteremia     Past Surgical History:  Procedure Laterality Date   ABDOMINAL HYSTERECTOMY     OPEN REDUCTION INTERNAL FIXATION (ORIF) DISTAL RADIAL FRACTURE Left 07/12/2022   Procedure: OPEN REDUCTION INTERNAL FIXATION (ORIF) DISTAL RADIUS FRACTURE;  Surgeon: Kennedy Bucker, MD;  Location: [facility];  Service: Orthopedics;  Laterality: Left;    There were no vitals filed for this visit.   Subjective Assessment - 10/07/22 1253     Subjective  Pt reports she was a few mins late due to her transportation.  Interpreter present this date in person, Remigio Eisenmenger    Pertinent History Ortho NOTE 07/26/22 -ORIF, left distal radius - 07/09/22, By Dr. [name] is a 62 y.o. female who presents today status post left distal radius ORIF by Dr. Kennedy Bucker on 07/12/2022. Patient has finished the Norco, has taken occasional ibuprofen only 3 tablets over the last couple of weeks. She is having some pain and swelling throughout the hand and wrist but overall symptoms are improving. She has performed very little exercises on her own, she is guarded secondary to discomfort. She denies any numbness or tingling. Sutures removed.  REFER to OT    Patient Stated Goals Want to get my L hand and wrist better to use it at home and work    Currently in Pain? Yes    Pain Score 4     Pain Location Arm    Pain Orientation Left;Upper    Pain Descriptors / Indicators Aching;Tightness    Pain Type Acute pain    Pain Onset More than a month ago    Pain Frequency Intermittent            Pt continues to have pain in left upper arm, reports she will try to pay attention to how she has it positioned this weekend.  She continues to report stiffness in her left hand and wrist.    Fluidotherapy:   Pt seen this date for use of fluido therapy to decrease pain, stiffness and improve ROM and tissue mobility, performed prior to manual therapy techniques.   Manual Therapy:   Following fluido therapy, pt seen for soft tissue massage, scar massage, metacarpal and carpal spreads, webspace stretch prior to performing range of motion.     Therapeutic Exercises:     PROM and prolonged flexion stretch to MCPs followed by composite flexion   2 pound weight for supination pronation,  radial/ulnar deviation Pt to cont with wrist flexion /extension stretches - prolonged stretches   2 minutes Table slides for wrist extension 20 reps performed with  cues for proper arm positioning and technique.   CPM for wrist extension 3 x 200 sec  Flexion 1 x 200 sec Performed 1 lbs 10 reps for 2 sets- place and hold for extension and relax into flexion      HEP, continue with:  Composite flexion stretch prior to putty at home.  medium teal putty with lateral and 3-point pinch, Pain-free Patient to do pain-free 1 set of 12-15 2 times a day Pt to continue to pay attention to positioning of left arm and shoulder when not using, will follow up with MD regarding pain in upper arm and stiffness in wrist and hand.     OT Education - 10/07/22 1254     Education Details progress and reinforce HEP, positioning of left arm when not using    Person(s) Educated  Patient    Methods Explanation;Demonstration;Tactile cues;Verbal cues;Handout    Comprehension Verbal cues required;Returned demonstration;Verbalized understanding              OT Short Term Goals - 10/07/22 0948       OT SHORT TERM GOAL #1   Title Pt to be ind in HEP to decrease edema , pain and increase ROM in digits to touch palm and opposition to all digits    Baseline increase edema, sterristrips in place -pain 4-10/10 in hand and wrist with AROM - limited in flexion of thumb and digits  10th visit:  she still has pain with exercise and movement up to 6/10.  Recert:  Pt HEP changing as patient progresses, she still requires reinstruction and cues for proper form and technique.    Time 4    Period Weeks    Status On-going    Target Date 10/28/22               OT Long Term Goals - 10/07/22 0949       OT LONG TERM GOAL #1   Title L wrist AROM increase to [facility] for pt to use hand to turn doorknob and use more than 50% in bathing and dressing    Baseline Wrist AROM ext 10, flexion 50; sup 50 and RD 8, UD 20- not using hand, 10th visit:  Independent with bathing and dressing and using at least 50%.    Time 6    Period Weeks    Status Achieved    Target Date 09/17/22      OT LONG TERM GOAL #2   Title Scar tissue improve for pt to tolerate scar massage, different textures to facilitate increase AROM at wrist flexion, ext and touching palm with digits    Baseline Sterri srtrips removed today - applied one again - cannot initate scar massage yet- - decrease wrist and digits AROM, 10th visit:  improved toleration of scar massage, still working to increase AROM.  Recert:  Pt with improved sensation and toleration of massage, able to demonstrate scar massage as a part of home program.  Still working on active motion.    Time 4    Period Weeks    Status On-going    Target Date 10/28/22      OT LONG TERM GOAL #3   Title L Grip and prehension strenght incease to more than 60%  compare to R hand for pt to carry more than 5 lbs, turn doorknob, cook using hand more than 50%    Baseline 10th visit:  unable to carry objects greater than 5#, using hand for basic tasks as an assist  and support but not able to perform normal cooking tasks and family assisting.  Recert:  Continued difficulty with carrying objects up to 5# and still not using arm in normal movement patterns.    Time 4    Period Weeks    Status On-going    Target Date 10/28/22      OT LONG TERM GOAL #4   Title L wrist and forearm strength increase to WNL for pt to push and pull heavy door, push up and turn doorknob and key without increase symptoms    Baseline 3 1/2 wks s/p - no strength yet.  10th visit:  unable to use hand to push open heavy door. Recert:  Unable to push open heavy door.    Time 4    Period Weeks    Status On-going    Target Date 10/28/22                   Plan - 10/07/22 1255     Clinical Impression Statement Pt is 11 wks s/p L ORIF distal radius fx - surgery by Dr Rosita Kea on 07/12/22. Pt limited since eval greatly by edema, pain and stiffness in digits flexion and wrist ext more than flexion. Pt scar adhere and tender - focus the last few session on scar tissue, composite digits flexion and wrist flexion and extension.  Pt is making slow but steady progress the last few sessions -but cont to need review of same HEP to focus and pain over dorsal hand and wrist. CPM for wrist extension and flexion session. Strength  remains limited with comparision from left to right.  Pt continues to report increased pain in left upper arm, near biceps area, she tends to hold arm in protective position likely overactivating her biceps which is likely causing pain and muscle imbalance.  Discussed positioning and will continue to reinforce to see if this will impact her upper arm pain over the next week.  Pt continues to require therapist demonstration and cues for proper form and technique for exercises.  Pt  to follow up with MD regarding her arm pain and limited ROM of wrist and hand.   Pt continues to benefit from continued skilled OT services to decrease edema ,pain and scar tissue to increase ROM and strength in L hand and wrist to return to prior level of function.    OT Occupational Profile and History Problem Focused Assessment - Including review of records relating to presenting problem    Occupational performance deficits (Please refer to evaluation for details): ADL's;IADL's;Leisure;Social Participation;Work;Play    Body Structure / Function / Physical Skills ADL;IADL;Strength;Pain;Edema;Sensation;Dexterity;Decreased knowledge of precautions;Flexibility;ROM;UE functional use;Scar mobility;FMC    Rehab Potential Good    Clinical Decision Making Limited treatment options, no task modification necessary    Comorbidities Affecting Occupational Performance: None    Modification or Assistance to Complete Evaluation  No modification of tasks or assist necessary to complete eval    OT Frequency 2x / week    OT Duration 4 weeks    OT Treatment/Interventions Self-care/ADL training;Contrast Bath;DME and/or AE instruction;Manual Therapy;Passive range of motion;Scar mobilization;Fluidtherapy;Paraffin;Splinting;Patient/family education;Therapeutic exercise    Consulted and Agree with Plan of Care Patient             Patient will benefit from skilled therapeutic intervention in order to improve the following deficits and impairments:   Body Structure / Function / Physical Skills: ADL, IADL, Strength, Pain, Edema, Sensation, Dexterity, Decreased knowledge of precautions, Flexibility, ROM, UE functional  use, Scar mobility, FMC       Visit Diagnosis: Pain in left hand  Stiffness of left wrist, not elsewhere classified  Muscle weakness (generalized)  Scar condition and fibrosis of skin  Stiffness of left hand, not elsewhere classified   Kishana Battey T Arne Cleveland, OTR/L, CLT  Nikalas Bramel, OT 10/07/2022,  1:06 PM  [facility] [address], [place], [address] Phone: [phone]   Fax:  [phone]  Name: Carolyn Hogan MRN: [id number] Date of Birth: Apr 23, 1961

## 2022-10-07 NOTE — Therapy (Signed)
[facility] [address], [place], [address] Phone: [phone]   Fax:  [phone]  Occupational Therapy Treatment/Recertification  Patient Details  Name: Carolyn Hogan MRN: [id number] Date of Birth: [birth year] Referring Provider (OT): Brillion Georgia   Encounter Date: 09/30/2022   OT End of Session - 10/07/22 0941     Visit Number 16    Number of Visits 26    Date for OT Re-Evaluation 10/28/22    Authorization Type 18 authorized visits with worker's comp    OT Start Time 1032    OT Stop Time 1115    OT Time Calculation (min) 43 min    Activity Tolerance Patient tolerated treatment well    Behavior During Therapy [facility] for tasks assessed/performed             Past Medical History:  Diagnosis Date   Hypercholesteremia     Past Surgical History:  Procedure Laterality Date   ABDOMINAL HYSTERECTOMY     OPEN REDUCTION INTERNAL FIXATION (ORIF) DISTAL RADIAL FRACTURE Left 07/12/2022   Procedure: OPEN REDUCTION INTERNAL FIXATION (ORIF) DISTAL RADIUS FRACTURE;  Surgeon: Kennedy Bucker, MD;  Location: [facility];  Service: Orthopedics;  Laterality: Left;    There were no vitals filed for this visit.   Subjective Assessment - 10/07/22 0941     Subjective  No interpreter present intially, called worker's compensation and they provided an interpreter over the phone, Dorene Grebe.  Pt reports she is having some pain in her upper arm but has been trying to use the hand more at home.    Pertinent History Ortho NOTE 07/26/22 -ORIF, left distal radius - 07/09/22, By Dr. [name] is a 62 y.o. female who presents today status post left distal radius ORIF by Dr. Kennedy Bucker on 07/12/2022. Patient has finished the Norco, has taken occasional ibuprofen only 3 tablets over the last couple of weeks. She is having some pain and swelling throughout the hand and wrist but overall symptoms are improving. She has performed  very little exercises on her own, she is guarded secondary to discomfort. She denies any numbness or tingling. Sutures removed. REFER to OT    Patient Stated Goals Want to get my L hand and wrist better to use it at home and work    Currently in Pain? Yes    Pain Score 4     Pain Location Arm    Pain Orientation Left;Upper    Pain Descriptors / Indicators Aching;Tightness    Pain Type Acute pain    Pain Onset More than a month ago    Pain Frequency Intermittent                [facility] OT Assessment - 10/07/22 0955       AROM   Left Forearm Pronation 90 Degrees    Left Forearm Supination 90 Degrees    Left Wrist Extension 40 Degrees    Left Wrist Flexion 65 Degrees    Left Wrist Radial Deviation 22 Degrees    Left Wrist Ulnar Deviation 27 Degrees      Strength   Right Hand Grip (lbs) 43    Right Hand Lateral Pinch 12 lbs    Right Hand 3 Point Pinch 10 lbs    Left Hand Grip (lbs) 16    Left Hand Lateral Pinch 8 lbs    Left Hand 3 Point Pinch 7 lbs      Left Hand AROM  L Thumb MCP 0-60 55 Degrees    L Thumb IP 0-80 34 Degrees    L Thumb Radial ADduction/ABduction 0-55 52    L Thumb Palmar ADduction/ABduction 0-45 50    L Index  MCP 0-90 82 Degrees    L Index PIP 0-100 100 Degrees    L Long  MCP 0-90 85 Degrees    L Long PIP 0-100 100 Degrees    L Ring  MCP 0-90 90 Degrees    L Ring PIP 0-100 100 Degrees    L Little  MCP 0-90 84 Degrees    L Little PIP 0-100 100 Degrees            Pt seen for reassessment of hand function as outlined above in flowsheet, please refer to details.    Paraffin:  Pt seen for paraffin bath this date for 8 mins, coban flexion wrap to all digits while dipping in paraffin, added heat pack to outside to decrease pain, increase motion and increase tissue mobility.    Manual Therapy:   Following paraffin, pt seen for soft tissue massage, scar massage, metacarpal and carpal spreads, webspace stretch prior to performing range of motion.     Therapeutic Exercises:    PROM and prolonged flexion stretch to MCPs followed by composite flexion    teal medium putty  lateral and 3-point pinch to but to do on her other hand - and not on table and ABD shoulder causing pain in upper arm  Patient to do pain-free 1 set of 12-15 2 times a day 2 pound weight for supination pronation as well as radial ulnar deviation Pt to cont with wrist flexion /extension stretches - prolonged stretches   2 minutes Table slides for wrist extension 20 reps performed with cues for proper arm positioning and technique.   CPM for wrist extension 2 x 200 sec  Flexion 1 x 200 sec Performed 1 lbs 12 reps - place and hold for extension and relax into flexion    HEP: Composite flexion stretch prior to putty at home.  medium teal putty with lateral and 3-point pinch Pain-free Patient to do pain-free 1 set of 12-15 2 times a day Pt to watch positioning of left arm and shoulder, tends to keep arm in a protected position and likely activating biceps all the time and causing increased muscle fatigue and pain.      OT Education - 10/07/22 0941     Education Details progress and reinforce HEP    Person(s) Educated Patient    Methods Explanation;Demonstration;Tactile cues;Verbal cues;Handout    Comprehension Verbal cues required;Returned demonstration;Verbalized understanding              OT Short Term Goals - 10/07/22 0948       OT SHORT TERM GOAL #1   Title Pt to be ind in HEP to decrease edema , pain and increase ROM in digits to touch palm and opposition to all digits    Baseline increase edema, sterristrips in place -pain 4-10/10 in hand and wrist with AROM - limited in flexion of thumb and digits  10th visit:  she still has pain with exercise and movement up to 6/10.  Recert:  Pt HEP changing as patient progresses, she still requires reinstruction and cues for proper form and technique.    Time 4    Period Weeks    Status On-going    Target Date  10/28/22  OT Long Term Goals - 10/07/22 0949       OT LONG TERM GOAL #1   Title L wrist AROM increase to [facility] for pt to use hand to turn doorknob and use more than 50% in bathing and dressing    Baseline Wrist AROM ext 10, flexion 50; sup 50 and RD 8, UD 20- not using hand, 10th visit:  Independent with bathing and dressing and using at least 50%.    Time 6    Period Weeks    Status Achieved    Target Date 09/17/22      OT LONG TERM GOAL #2   Title Scar tissue improve for pt to tolerate scar massage, different textures to facilitate increase AROM at wrist flexion, ext and touching palm with digits    Baseline Sterri srtrips removed today - applied one again - cannot initate scar massage yet- - decrease wrist and digits AROM, 10th visit:  improved toleration of scar massage, still working to increase AROM.  Recert:  Pt with improved sensation and toleration of massage, able to demonstrate scar massage as a part of home program.  Still working on active motion.    Time 4    Period Weeks    Status On-going    Target Date 10/28/22      OT LONG TERM GOAL #3   Title L Grip and prehension strenght incease to more than 60% compare to R hand for pt to carry more than 5 lbs, turn doorknob, cook using hand more than 50%    Baseline 10th visit:  unable to carry objects greater than 5#, using hand for basic tasks as an assist and support but not able to perform normal cooking tasks and family assisting.  Recert:  Continued difficulty with carrying objects up to 5# and still not using arm in normal movement patterns.    Time 4    Period Weeks    Status On-going    Target Date 10/28/22      OT LONG TERM GOAL #4   Title L wrist and forearm strength increase to WNL for pt to push and pull heavy door, push up and turn doorknob and key without increase symptoms    Baseline 3 1/2 wks s/p - no strength yet.  10th visit:  unable to use hand to push open heavy door. Recert:  Unable to push  open heavy door.    Time 4    Period Weeks    Status On-going    Target Date 10/28/22                   Plan - 10/07/22 0943     Clinical Impression Statement Pt is 11 wks s/p L ORIF distal radius fx - surgery by Dr Rosita Kea on 07/12/22. Pt limited since eval greatly by edema, pain and stiffness in digits flexion and wrist ext more than flexion. Pt scar adhere and tender - focus the last few session on scar tissue, composite digits flexion and wrist flexion and extention.  Pt is making slow but steady progress the last 3 sessions -but cont to need review of same HEP to focus and pain over dorsal hand and wrist. Also report increase discomfort and pain but not consistant in lateral upper arm. CPM for wrist extension and flexion session. Strength  remains limited with comparision from left to right.  Pt continues to report increased pain in left upper arm, near biceps area, she tends to hold arm in  protective position likely overactivating her biceps which is likely causing pain and muscle imbalance.  Discussed positioning and will continue to reinforce to see if this will impact her upper arm pain over the next week.  Pt to follow up with MD regarding her arm pain and limited ROM of wrist and hand.   Pt continues to benefit from continued skilled OT services to decrease edema ,pain and scar tissue to increase ROM and strength in L hand and wrist to return to prior level of function.    OT Occupational Profile and History Problem Focused Assessment - Including review of records relating to presenting problem    Occupational performance deficits (Please refer to evaluation for details): ADL's;IADL's;Leisure;Social Participation;Work;Play    Body Structure / Function / Physical Skills ADL;IADL;Strength;Pain;Edema;Sensation;Dexterity;Decreased knowledge of precautions;Flexibility;ROM;UE functional use;Scar mobility;FMC    Rehab Potential Good    Clinical Decision Making Limited treatment options, no  task modification necessary    Comorbidities Affecting Occupational Performance: None    Modification or Assistance to Complete Evaluation  No modification of tasks or assist necessary to complete eval    OT Frequency 2x / week    OT Duration 4 weeks    OT Treatment/Interventions Self-care/ADL training;Contrast Bath;DME and/or AE instruction;Manual Therapy;Passive range of motion;Scar mobilization;Fluidtherapy;Paraffin;Splinting;Patient/family education;Therapeutic exercise    Consulted and Agree with Plan of Care Patient             Patient will benefit from skilled therapeutic intervention in order to improve the following deficits and impairments:   Body Structure / Function / Physical Skills: ADL, IADL, Strength, Pain, Edema, Sensation, Dexterity, Decreased knowledge of precautions, Flexibility, ROM, UE functional use, Scar mobility, FMC       Visit Diagnosis: Pain in left hand  Pain in left wrist  Stiffness of left wrist, not elsewhere classified  Stiffness of left hand, not elsewhere classified  Scar condition and fibrosis of skin  Muscle weakness (generalized)    Problem List There are no problems to display for this patient.  Carolyn Hogan, OTR/L, CLT  Callan Norden, OT 10/07/2022, 10:14 AM  [facility] [address], [place], [address] Phone: [phone]   Fax:  [phone]  Name: Carolyn Hogan MRN: [id number] Date of Birth: Oct 15, 1960

## 2022-10-07 NOTE — Therapy (Signed)
[facility] [address], [place], [address] Phone: [phone]   Fax:  [phone]  Occupational Therapy Treatment  Patient Details  Name: Carolyn Hogan MRN: [id number] Date of Birth: 05-18-1961 Referring Provider (OT): Draper Georgia   Encounter Date: 10/07/2022   OT End of Session - 10/07/22 1538     Visit Number 18    Number of Visits 26    Date for OT Re-Evaluation 10/28/22    OT Start Time 1430    OT Stop Time 1533    OT Time Calculation (min) 63 min    Activity Tolerance Patient tolerated treatment well    Behavior During Therapy [facility] for tasks assessed/performed             Past Medical History:  Diagnosis Date   Hypercholesteremia     Past Surgical History:  Procedure Laterality Date   ABDOMINAL HYSTERECTOMY     OPEN REDUCTION INTERNAL FIXATION (ORIF) DISTAL RADIAL FRACTURE Left 07/12/2022   Procedure: OPEN REDUCTION INTERNAL FIXATION (ORIF) DISTAL RADIUS FRACTURE;  Surgeon: Kennedy Bucker, MD;  Location: [facility];  Service: Orthopedics;  Laterality: Left;    There were no vitals filed for this visit.   Subjective Assessment - 10/07/22 1538     Subjective  I am uisng my L hand to support or in everything around the house except lifting heavy things like pots, sweeping , pushing shopping cart    Pertinent History Ortho NOTE 07/26/22 -ORIF, left distal radius - 07/09/22, By Dr. [name] is a 62 y.o. female who presents today status post left distal radius ORIF by Dr. Kennedy Bucker on 07/12/2022. Patient has finished the Norco, has taken occasional ibuprofen only 3 tablets over the last couple of weeks. She is having some pain and swelling throughout the hand and wrist but overall symptoms are improving. She has performed very little exercises on her own, she is guarded secondary to discomfort. She denies any numbness or tingling. Sutures removed. REFER to OT    Patient Stated  Goals Want to get my L hand and wrist better to use it at home and work    Currently in Pain? Yes    Pain Score 3     Pain Location Arm    Pain Orientation Left;Upper    Pain Descriptors / Indicators Aching              Wrist extention increase 48 but flexion same Grip and 3 point pinch same -lat pinch increase to 9 lbs   Simulation of activities done with patient with functional activities like pushing and pulling heavy door patient compensating showing decreased strength in elbow and shoulder with discomfort at wrist reported. Unable to push up from chair reports pain on volar wrist favoring right. Compensating with elbow and shoulder abduction and rotation with doorknob and door handle. Patient was able to carry up to 6 pounds with ease. Reported 7 pounds heavy and difficulty picking up with normal arm pattern putting weight on table. With a 3 to 4 pound patient was able to carry and pick up with stimulation of pouring and drink patient compensating with shoulder abduction elbow extension. Patient report at work she has to be able to do 10 pounds at 3 different heights using her right arm but left needs to support weight pulling it and sliding it then.  Simulate with patient work conditioning activity incorporating left wrist elbow and shoulder. Using a pound  weight bilateral hands patient to move 8 pound weight from waist height to chest height to overhead back-and-forth 10 reps With 5 pounds patient was able to do moving object with the right hand supporting the left same 3 heights. Patient can do that with a gallon of milk over half a gallon, or a bag of sugar or flour that is about the same weight With 2 pound weight patient to work on elbow flexion and extension as well as punches forward to shoulder height and overhead shelves 10 reps. Attempted with patient 4 pounds but was compensating with shoulder and discomfort reported.  Assess patient's wrist active range of motion and  grip and prehension see above.                     OT Education - 10/07/22 1538     Education Details progress and reinforce HEP, positioning of left arm when not using    Person(s) Educated Patient    Methods Explanation;Demonstration;Tactile cues;Verbal cues;Handout    Comprehension Verbal cues required;Returned demonstration;Verbalized understanding              OT Short Term Goals - 10/07/22 0948       OT SHORT TERM GOAL #1   Title Pt to be ind in HEP to decrease edema , pain and increase ROM in digits to touch palm and opposition to all digits    Baseline increase edema, sterristrips in place -pain 4-10/10 in hand and wrist with AROM - limited in flexion of thumb and digits  10th visit:  she still has pain with exercise and movement up to 6/10.  Recert:  Pt HEP changing as patient progresses, she still requires reinstruction and cues for proper form and technique.    Time 4    Period Weeks    Status On-going    Target Date 10/28/22               OT Long Term Goals - 10/07/22 0949       OT LONG TERM GOAL #1   Title L wrist AROM increase to [facility] for pt to use hand to turn doorknob and use more than 50% in bathing and dressing    Baseline Wrist AROM ext 10, flexion 50; sup 50 and RD 8, UD 20- not using hand, 10th visit:  Independent with bathing and dressing and using at least 50%.    Time 6    Period Weeks    Status Achieved    Target Date 09/17/22      OT LONG TERM GOAL #2   Title Scar tissue improve for pt to tolerate scar massage, different textures to facilitate increase AROM at wrist flexion, ext and touching palm with digits    Baseline Sterri srtrips removed today - applied one again - cannot initate scar massage yet- - decrease wrist and digits AROM, 10th visit:  improved toleration of scar massage, still working to increase AROM.  Recert:  Pt with improved sensation and toleration of massage, able to demonstrate scar massage as a part of home  program.  Still working on active motion.    Time 4    Period Weeks    Status On-going    Target Date 10/28/22      OT LONG TERM GOAL #3   Title L Grip and prehension strenght incease to more than 60% compare to R hand for pt to carry more than 5 lbs, turn doorknob, cook using hand more than  50%    Baseline 10th visit:  unable to carry objects greater than 5#, using hand for basic tasks as an assist and support but not able to perform normal cooking tasks and family assisting.  Recert:  Continued difficulty with carrying objects up to 5# and still not using arm in normal movement patterns.    Time 4    Period Weeks    Status On-going    Target Date 10/28/22      OT LONG TERM GOAL #4   Title L wrist and forearm strength increase to WNL for pt to push and pull heavy door, push up and turn doorknob and key without increase symptoms    Baseline 3 1/2 wks s/p - no strength yet.  10th visit:  unable to use hand to push open heavy door. Recert:  Unable to push open heavy door.    Time 4    Period Weeks    Status On-going    Target Date 10/28/22                   Plan - 10/07/22 1539     Clinical Impression Statement Pt is 12 wks s/p L ORIF distal radius fx - surgery by Dr Rosita Kea on 07/12/22. Pt limited since eval greatly by edema, pain and stiffness in digits flexion and wrist ext more than flexion. Pt scar adhere and tender. Limiting her  composite digits flexion and wrist flexion and extension.  Pt is making slow but steady progress the last few sessions -but cont to need review of same HEP to focus and pain over dorsal hand and wrist. CPM for wrist extension and flexion done the last few sessions but strength  remains limited with comparision from left to right.  Pt continues to report increased pain in left upper arm, near biceps area, she tends to hold arm in protective position likely overactivating her biceps which is likely causing pain and muscle imbalance.  Discussed positioning and  will continue to reinforce to see if this will impact her upper arm pain over the next week. This date assess her functional ability to use L hand and arm in activities and work simulation- pt to work this next few days on bilateral hand 8 lbs , R hand with support of L 4 lbs and L hand 2 lbs - moving object from waist height shelve to chest , overhead at home.   Pt to follow up with MD regarding her arm pain and limited ROM of wrist and hand.   Pt continues to benefit from continued skilled OT services to decrease edema ,pain and scar tissue to increase ROM and strength in L hand and wrist to return to prior level of function.    OT Occupational Profile and History Problem Focused Assessment - Including review of records relating to presenting problem    Occupational performance deficits (Please refer to evaluation for details): ADL's;IADL's;Leisure;Social Participation;Work;Play    Body Structure / Function / Physical Skills ADL;IADL;Strength;Pain;Edema;Sensation;Dexterity;Decreased knowledge of precautions;Flexibility;ROM;UE functional use;Scar mobility;FMC    Rehab Potential Good    Clinical Decision Making Limited treatment options, no task modification necessary    Comorbidities Affecting Occupational Performance: None    Modification or Assistance to Complete Evaluation  No modification of tasks or assist necessary to complete eval    OT Frequency 2x / week    OT Duration 4 weeks    OT Treatment/Interventions Self-care/ADL training;Contrast Bath;DME and/or AE instruction;Manual Therapy;Passive range of motion;Scar mobilization;Fluidtherapy;Paraffin;Splinting;Patient/family education;Therapeutic exercise  Consulted and Agree with Plan of Care Patient             Patient will benefit from skilled therapeutic intervention in order to improve the following deficits and impairments:   Body Structure / Function / Physical Skills: ADL, IADL, Strength, Pain, Edema, Sensation, Dexterity,  Decreased knowledge of precautions, Flexibility, ROM, UE functional use, Scar mobility, FMC       Visit Diagnosis: Pain in left hand  Stiffness of left wrist, not elsewhere classified  Muscle weakness (generalized)  Scar condition and fibrosis of skin  Stiffness of left hand, not elsewhere classified  Pain in left wrist    Problem List There are no problems to display for this patient.   Oletta Cohn, OTR/L,CLT 10/07/2022, 4:27 PM  [facility] [address], [place], [address] Phone: [phone]   Fax:  [phone]  Name: Cathrine Krizan MRN: [id number] Date of Birth: 08/19/1960

## 2022-10-11 ENCOUNTER — Ambulatory Visit: Payer: Worker's Compensation | Attending: Orthopedic Surgery | Admitting: Occupational Therapy

## 2022-10-11 DIAGNOSIS — M25642 Stiffness of left hand, not elsewhere classified: Secondary | ICD-10-CM | POA: Diagnosis present

## 2022-10-11 DIAGNOSIS — M25532 Pain in left wrist: Secondary | ICD-10-CM | POA: Insufficient documentation

## 2022-10-11 DIAGNOSIS — M79642 Pain in left hand: Secondary | ICD-10-CM | POA: Insufficient documentation

## 2022-10-11 DIAGNOSIS — L905 Scar conditions and fibrosis of skin: Secondary | ICD-10-CM | POA: Insufficient documentation

## 2022-10-11 DIAGNOSIS — M25632 Stiffness of left wrist, not elsewhere classified: Secondary | ICD-10-CM | POA: Insufficient documentation

## 2022-10-11 DIAGNOSIS — M6281 Muscle weakness (generalized): Secondary | ICD-10-CM | POA: Insufficient documentation

## 2022-10-11 NOTE — Therapy (Signed)
[facility] [address] [place], [address] Phone: [phone]   Fax:  [phone]  Occupational Therapy Treatment  Patient Details  Name: Carolyn Hogan MRN: [id number] Date of Birth: Nov 06, 1960 Referring Provider (OT): [place]   Encounter Date: 10/11/2022   OT End of Session - 10/11/22 1126     Visit Number 19    Number of Visits 26    Date for OT Re-Evaluation 10/28/22    OT Start Time 1126    OT Stop Time 1214    OT Time Calculation (min) 48 min    Activity Tolerance Patient tolerated treatment well    Behavior During Therapy [facility] for tasks assessed/performed             Past Medical History:  Diagnosis Date   Hypercholesteremia     Past Surgical History:  Procedure Laterality Date   ABDOMINAL HYSTERECTOMY     OPEN REDUCTION INTERNAL FIXATION (ORIF) DISTAL RADIAL FRACTURE Left 07/12/2022   Procedure: OPEN REDUCTION INTERNAL FIXATION (ORIF) DISTAL RADIUS FRACTURE;  Surgeon: Kennedy Bucker, MD;  Location: [facility];  Service: Orthopedics;  Laterality: Left;    There were no vitals filed for this visit.   Subjective Assessment - 10/11/22 1125     Subjective  My hand stiff every morning but then during day if I use it is better- done the weights - but scar still tender and at the wrist    Pertinent History Ortho NOTE 07/26/22 -ORIF, left distal radius - 07/09/22, By Dr. [name] is a 62 y.o. female who presents today status post left distal radius ORIF by Dr. Kennedy Bucker on 07/12/2022. Patient has finished the Norco, has taken occasional ibuprofen only 3 tablets over the last couple of weeks. She is having some pain and swelling throughout the hand and wrist but overall symptoms are improving. She has performed very little exercises on her own, she is guarded secondary to discomfort. She denies any numbness or tingling. Sutures removed. REFER to OT    Patient Stated Goals Want to  get my L hand and wrist better to use it at home and work    Currently in Pain? Yes    Pain Score 3     Pain Location --   distal scar   Pain Orientation Left    Pain Descriptors / Indicators Tightness;Tender    Pain Type Surgical pain                [facility] OT Assessment - 10/11/22 0001       AROM   Left Forearm Pronation 90 Degrees    Left Forearm Supination 90 Degrees    Left Wrist Extension 40 Degrees   45 in session   Left Wrist Flexion 65 Degrees   70 in session   Left Wrist Radial Deviation 20 Degrees    Left Wrist Ulnar Deviation 27 Degrees               Assess pt's AROM in wrist and forearm -and strength AROM for ext and flexion cont to be the same- and strength decrease in ext, UD and sup Scar adhere and tender distally -limiting ROM  With wrist ext and flexion -pt feel pull or discomfort always on dorsal wrist Stiffness cont to be with composite flexion of digits - worse in am but improving during day Address and review with pt stretch for at home wrist flexion, ext and digits composite  flexion Table slides for wrist ext - keeping weight thru palm - 20 reps -several times during day Flexion over edge of table - prolonged stretch   CPM done afterwards - 200 sec each With extractor for scar mobs to distal volar scar - tender and adhere- wrist extention at the same time Same progress with CPM and prolonged stretches on table    Patient report at work she has to be able to do 10 pounds at 3 different heights using her right arm but left needs to support weight pulling it and sliding it then.   Simulate with patient work conditioning activity incorporating left wrist elbow and shoulder. Using a  8 pound weight bilateral hands patient to move 8 pound weight from waist height to chest height to overhead back-and-forth 10 reps With 5 pounds patient was able to do moving object with the right hand supporting the left same 3 heights. Patient can do that with a  gallon of milk over half a gallon, or a bag of sugar or flour that is about the same weight With 2 pound weight patient to work on elbow flexion and extension as well as punches forward to shoulder height and overhead shelves 10 reps. Attempted with patient 4 pounds but was compensating with shoulder and discomfort reported.                      OT Education - 10/11/22 1220     Education Details progress and reinforce HEP, positioning of left arm when not using    Person(s) Educated Patient    Methods Explanation;Demonstration;Tactile cues;Verbal cues;Handout    Comprehension Verbal cues required;Returned demonstration;Verbalized understanding              OT Short Term Goals - 10/07/22 0948       OT SHORT TERM GOAL #1   Title Pt to be ind in HEP to decrease edema , pain and increase ROM in digits to touch palm and opposition to all digits    Baseline increase edema, sterristrips in place -pain 4-10/10 in hand and wrist with AROM - limited in flexion of thumb and digits  10th visit:  she still has pain with exercise and movement up to 6/10.  Recert:  Pt HEP changing as patient progresses, she still requires reinstruction and cues for proper form and technique.    Time 4    Period Weeks    Status On-going    Target Date 10/28/22               OT Long Term Goals - 10/07/22 0949       OT LONG TERM GOAL #1   Title L wrist AROM increase to [facility] for pt to use hand to turn doorknob and use more than 50% in bathing and dressing    Baseline Wrist AROM ext 10, flexion 50; sup 50 and RD 8, UD 20- not using hand, 10th visit:  Independent with bathing and dressing and using at least 50%.    Time 6    Period Weeks    Status Achieved    Target Date 09/17/22      OT LONG TERM GOAL #2   Title Scar tissue improve for pt to tolerate scar massage, different textures to facilitate increase AROM at wrist flexion, ext and touching palm with digits    Baseline Sterri srtrips  removed today - applied one again - cannot initate scar massage yet- - decrease wrist and digits  AROM, 10th visit:  improved toleration of scar massage, still working to increase AROM.  Recert:  Pt with improved sensation and toleration of massage, able to demonstrate scar massage as a part of home program.  Still working on active motion.    Time 4    Period Weeks    Status On-going    Target Date 10/28/22      OT LONG TERM GOAL #3   Title L Grip and prehension strenght incease to more than 60% compare to R hand for pt to carry more than 5 lbs, turn doorknob, cook using hand more than 50%    Baseline 10th visit:  unable to carry objects greater than 5#, using hand for basic tasks as an assist and support but not able to perform normal cooking tasks and family assisting.  Recert:  Continued difficulty with carrying objects up to 5# and still not using arm in normal movement patterns.    Time 4    Period Weeks    Status On-going    Target Date 10/28/22      OT LONG TERM GOAL #4   Title L wrist and forearm strength increase to WNL for pt to push and pull heavy door, push up and turn doorknob and key without increase symptoms    Baseline 3 1/2 wks s/p - no strength yet.  10th visit:  unable to use hand to push open heavy door. Recert:  Unable to push open heavy door.    Time 4    Period Weeks    Status On-going    Target Date 10/28/22                   Plan - 10/11/22 1126     Clinical Impression Statement Pt is  3 months s/p L ORIF distal radius fx - surgery by Dr Rosita Kea on 07/12/22. Pt was at eval limited greatly by edema, pain and stiffness in digits flexion and wrist ext more than flexion. Pt scar adhere and tender.  Pt made progress from [facility] but limited in progress with wrist ext and flexion - with doing stretches , CPM , PROM - staying about same- some progress in session but not able to maintain session to session and build on it. Last session assessed and add conditioning and  simulated work activities- 8lbs bilateral over head , 4 lbs R with L as support and 2 lbs with L alone. Scar adhere and limiting extention more than flexion- tender with scar mobs.   Pt continues to report  pain in left upper arm.  Pt to follow up with MD regarding her arm pain and limited ROM of wrist and hand end of next week.   Pt continues to benefit from continued skilled OT services to decrease edema ,pain and scar tissue to increase ROM and strength in L hand and wrist to return to prior level of function.    OT Occupational Profile and History Problem Focused Assessment - Including review of records relating to presenting problem    Occupational performance deficits (Please refer to evaluation for details): ADL's;IADL's;Leisure;Social Participation;Work;Play    Body Structure / Function / Physical Skills ADL;IADL;Strength;Pain;Edema;Sensation;Dexterity;Decreased knowledge of precautions;Flexibility;ROM;UE functional use;Scar mobility;FMC    Rehab Potential Good    Clinical Decision Making Limited treatment options, no task modification necessary    Comorbidities Affecting Occupational Performance: None    Modification or Assistance to Complete Evaluation  No modification of tasks or assist necessary to complete eval  OT Frequency 2x / week    OT Duration 4 weeks    OT Treatment/Interventions Self-care/ADL training;Contrast Bath;DME and/or AE instruction;Manual Therapy;Passive range of motion;Scar mobilization;Fluidtherapy;Paraffin;Splinting;Patient/family education;Therapeutic exercise    Consulted and Agree with Plan of Care Patient             Patient will benefit from skilled therapeutic intervention in order to improve the following deficits and impairments:   Body Structure / Function / Physical Skills: ADL, IADL, Strength, Pain, Edema, Sensation, Dexterity, Decreased knowledge of precautions, Flexibility, ROM, UE functional use, Scar mobility, FMC       Visit Diagnosis: Pain  in left hand  Stiffness of left wrist, not elsewhere classified  Muscle weakness (generalized)  Scar condition and fibrosis of skin  Pain in left wrist    Problem List There are no problems to display for this patient.   Oletta Cohn, OTR/L,CLT 10/11/2022, 12:27 PM  [facility] [address] [place], [address] Phone: [phone]   Fax:  [phone]  Name: Carolyn Hogan MRN: [id number] Date of Birth: 1961-01-04

## 2022-10-14 ENCOUNTER — Ambulatory Visit: Payer: BC Managed Care – PPO | Attending: Orthopedic Surgery | Admitting: Occupational Therapy

## 2022-10-14 DIAGNOSIS — M6281 Muscle weakness (generalized): Secondary | ICD-10-CM | POA: Insufficient documentation

## 2022-10-14 DIAGNOSIS — M79642 Pain in left hand: Secondary | ICD-10-CM

## 2022-10-14 DIAGNOSIS — M25632 Stiffness of left wrist, not elsewhere classified: Secondary | ICD-10-CM | POA: Diagnosis present

## 2022-10-14 DIAGNOSIS — M25532 Pain in left wrist: Secondary | ICD-10-CM

## 2022-10-14 DIAGNOSIS — M25642 Stiffness of left hand, not elsewhere classified: Secondary | ICD-10-CM | POA: Insufficient documentation

## 2022-10-14 DIAGNOSIS — L905 Scar conditions and fibrosis of skin: Secondary | ICD-10-CM | POA: Diagnosis present

## 2022-10-14 NOTE — Therapy (Signed)
[facility] [address], [place], [address] Phone: [phone]   Fax:  [phone]  Occupational Therapy Treatment/20th visit  Patient Details  Name: Carolyn Hogan MRN: [id number] Date of Birth: Oct 18, 1960 Referring Provider (OT): [place]   Encounter Date: 10/14/2022   OT End of Session - 10/14/22 1306     Visit Number 20    Number of Visits 26    Date for OT Re-Evaluation 10/28/22    OT Start Time 1202    OT Stop Time 1244    OT Time Calculation (min) 42 min    Activity Tolerance Patient tolerated treatment well    Behavior During Therapy [facility] for tasks assessed/performed             Past Medical History:  Diagnosis Date   Hypercholesteremia     Past Surgical History:  Procedure Laterality Date   ABDOMINAL HYSTERECTOMY     OPEN REDUCTION INTERNAL FIXATION (ORIF) DISTAL RADIAL FRACTURE Left 07/12/2022   Procedure: OPEN REDUCTION INTERNAL FIXATION (ORIF) DISTAL RADIUS FRACTURE;  Surgeon: Kennedy Bucker, MD;  Location: [facility];  Service: Orthopedics;  Laterality: Left;    There were no vitals filed for this visit.   Subjective Assessment - 10/14/22 1305     Subjective  I have done those exercises - still pain over the back of hand and wrist. Doing the weights.    Pertinent History Ortho NOTE 07/26/22 -ORIF, left distal radius - 07/09/22, By Dr. [name] is a 62 y.o. female who presents today status post left distal radius ORIF by Dr. Kennedy Bucker on 07/12/2022. Patient has finished the Norco, has taken occasional ibuprofen only 3 tablets over the last couple of weeks. She is having some pain and swelling throughout the hand and wrist but overall symptoms are improving. She has performed very little exercises on her own, she is guarded secondary to discomfort. She denies any numbness or tingling. Sutures removed. REFER to OT    Patient Stated Goals Want to get my L hand and wrist  better to use it at home and work    Currently in Pain? Yes    Pain Score 3     Pain Location Wrist    Pain Orientation Left   dorsal   Pain Descriptors / Indicators Aching;Tightness;Tender    Pain Type Surgical pain                [facility] OT Assessment - 10/14/22 0001       AROM   Left Forearm Pronation 90 Degrees    Left Forearm Supination 90 Degrees    Left Wrist Extension 45 Degrees    Left Wrist Flexion 75 Degrees      Strength   Left Hand Grip (lbs) 19    Left Hand Lateral Pinch 8 lbs    Left Hand 3 Point Pinch 8 lbs                    Patient arrives this date reporting focusing this weekend on wrist flexion extension as well as composite fist. Continues to have soreness and pain over the dorsal wrist.  As well as pain and tenderness over the distal scar. Might progress with flexion of digits. Patient to work on composite fist now.  Grip and 3 point did improved. Patient reports she lost her putty. Reviewed with patient gripping as well as 3 point pinch and lateral pinch with green  firm putty Grip 2 sets of 12 reps and prevention 1 set of 12-15 reps 2 times a day Patient is show some increased wrist flexion since last week. Extension about the same and pain over the dorsal wrist. Table slides 20 reps for wrist extension Passive range of motion for wrist flexion Ulnar and radial deviation within normal limits and 4+/5 strength Discomfort with supination pronation and range 4/5 strength. Reviewed with patient 2 pound weight for wrist and forearm in all planes. Patient did not do supination and pronation. Pain with wrist extension using 2 pound weight. Patient needs 50% of verbal and tactile cueing for following directions and doing exercises correctly.          OT Education - 10/14/22 1306     Education Details progress and reinforce HEP, positioning of left arm when not using    Person(s) Educated Patient    Methods  Explanation;Demonstration;Tactile cues;Verbal cues;Handout    Comprehension Verbal cues required;Returned demonstration;Verbalized understanding              OT Short Term Goals - 10/07/22 0948       OT SHORT TERM GOAL #1   Title Pt to be ind in HEP to decrease edema , pain and increase ROM in digits to touch palm and opposition to all digits    Baseline increase edema, sterristrips in place -pain 4-10/10 in hand and wrist with AROM - limited in flexion of thumb and digits  10th visit:  she still has pain with exercise and movement up to 6/10.  Recert:  Pt HEP changing as patient progresses, she still requires reinstruction and cues for proper form and technique.    Time 4    Period Weeks    Status On-going    Target Date 10/28/22               OT Long Term Goals - 10/07/22 0949       OT LONG TERM GOAL #1   Title L wrist AROM increase to [facility] for pt to use hand to turn doorknob and use more than 50% in bathing and dressing    Baseline Wrist AROM ext 10, flexion 50; sup 50 and RD 8, UD 20- not using hand, 10th visit:  Independent with bathing and dressing and using at least 50%.    Time 6    Period Weeks    Status Achieved    Target Date 09/17/22      OT LONG TERM GOAL #2   Title Scar tissue improve for pt to tolerate scar massage, different textures to facilitate increase AROM at wrist flexion, ext and touching palm with digits    Baseline Sterri srtrips removed today - applied one again - cannot initate scar massage yet- - decrease wrist and digits AROM, 10th visit:  improved toleration of scar massage, still working to increase AROM.  Recert:  Pt with improved sensation and toleration of massage, able to demonstrate scar massage as a part of home program.  Still working on active motion.    Time 4    Period Weeks    Status On-going    Target Date 10/28/22      OT LONG TERM GOAL #3   Title L Grip and prehension strenght incease to more than 60% compare to R hand for pt  to carry more than 5 lbs, turn doorknob, cook using hand more than 50%    Baseline 10th visit:  unable to carry objects greater than 5#,  using hand for basic tasks as an assist and support but not able to perform normal cooking tasks and family assisting.  Recert:  Continued difficulty with carrying objects up to 5# and still not using arm in normal movement patterns.    Time 4    Period Weeks    Status On-going    Target Date 10/28/22      OT LONG TERM GOAL #4   Title L wrist and forearm strength increase to WNL for pt to push and pull heavy door, push up and turn doorknob and key without increase symptoms    Baseline 3 1/2 wks s/p - no strength yet.  10th visit:  unable to use hand to push open heavy door. Recert:  Unable to push open heavy door.    Time 4    Period Weeks    Status On-going    Target Date 10/28/22                   Plan - 10/14/22 1307     Clinical Impression Statement Pt is about  3 months s/p L ORIF distal radius fx - surgery by Dr Rosita Kea on 07/12/22. Pt was at eval limited greatly by edema, pain and stiffness in digits flexion and wrist and forearm motion.  Pt scar cont to be adhere and tender.  Pt made progress from [facility] but limited in progress with wrist ext more than flexion - with doing stretches , CPM , PROM - pain mostly on dorsal wrist.  Last week assessed and add conditioning and simulated work activities- 8lbs bilateral over head , 4 lbs R with L as support and 2 lbs with L  wrist and UE alone. Reviewed again putty this date for grip and prehension -    Pt continues to report  pain in left upper arm.  Pt to follow up with MD regarding her arm pain and limited ROM of wrist ext, pain on dorsal wrist end of week.   Pt continues to benefit from continued skilled OT services to decrease edema ,pain and scar tissue to increase ROM and strength in L hand and wrist to return to prior level of function.    OT Occupational Profile and History Problem Focused Assessment -  Including review of records relating to presenting problem    Occupational performance deficits (Please refer to evaluation for details): ADL's;IADL's;Leisure;Social Participation;Work;Play    Body Structure / Function / Physical Skills ADL;IADL;Strength;Pain;Edema;Sensation;Dexterity;Decreased knowledge of precautions;Flexibility;ROM;UE functional use;Scar mobility;FMC    Rehab Potential Good    Clinical Decision Making Limited treatment options, no task modification necessary    Comorbidities Affecting Occupational Performance: None    Modification or Assistance to Complete Evaluation  No modification of tasks or assist necessary to complete eval    OT Frequency 2x / week    OT Duration 4 weeks    OT Treatment/Interventions Self-care/ADL training;Contrast Bath;DME and/or AE instruction;Manual Therapy;Passive range of motion;Scar mobilization;Fluidtherapy;Paraffin;Splinting;Patient/family education;Therapeutic exercise    Consulted and Agree with Plan of Care Patient             Patient will benefit from skilled therapeutic intervention in order to improve the following deficits and impairments:   Body Structure / Function / Physical Skills: ADL, IADL, Strength, Pain, Edema, Sensation, Dexterity, Decreased knowledge of precautions, Flexibility, ROM, UE functional use, Scar mobility, FMC       Visit Diagnosis: Pain in left hand  Stiffness of left wrist, not elsewhere classified  Muscle weakness (generalized)  Scar condition and fibrosis of skin  Pain in left wrist  Stiffness of left hand, not elsewhere classified    Problem List There are no problems to display for this patient.   Oletta Cohn, OTR/L,CLT 10/14/2022, 1:11 PM  [facility] [address], [place], [address] Phone: [phone]   Fax:  [phone]  Name: Neli Rhead MRN: [id number] Date of Birth: 02-27-1961

## 2022-10-17 ENCOUNTER — Ambulatory Visit: Payer: Worker's Compensation | Admitting: Occupational Therapy

## 2022-10-17 DIAGNOSIS — M79642 Pain in left hand: Secondary | ICD-10-CM

## 2022-10-17 DIAGNOSIS — M25632 Stiffness of left wrist, not elsewhere classified: Secondary | ICD-10-CM

## 2022-10-17 DIAGNOSIS — M6281 Muscle weakness (generalized): Secondary | ICD-10-CM

## 2022-10-17 DIAGNOSIS — L905 Scar conditions and fibrosis of skin: Secondary | ICD-10-CM

## 2022-10-17 DIAGNOSIS — M25532 Pain in left wrist: Secondary | ICD-10-CM

## 2022-10-17 DIAGNOSIS — M25642 Stiffness of left hand, not elsewhere classified: Secondary | ICD-10-CM

## 2022-10-17 NOTE — Therapy (Signed)
[facility] [address], [place], [address] Phone: [phone]   Fax:  [phone]  Occupational Therapy Treatment  Patient Details  Name: Carolyn Hogan MRN: [id number] Date of Birth: 26-Mar-1961 Referring Provider (OT): Leona Georgia   Encounter Date: 10/17/2022   OT End of Session - 10/17/22 1947     Visit Number 21    Number of Visits 26    Date for OT Re-Evaluation 10/28/22    OT Start Time 1445    OT Stop Time 1530    OT Time Calculation (min) 45 min    Activity Tolerance Patient tolerated treatment well    Behavior During Therapy [facility] for tasks assessed/performed             Past Medical History:  Diagnosis Date   Hypercholesteremia     Past Surgical History:  Procedure Laterality Date   ABDOMINAL HYSTERECTOMY     OPEN REDUCTION INTERNAL FIXATION (ORIF) DISTAL RADIAL FRACTURE Left 07/12/2022   Procedure: OPEN REDUCTION INTERNAL FIXATION (ORIF) DISTAL RADIUS FRACTURE;  Surgeon: Kennedy Bucker, MD;  Location: [facility];  Service: Orthopedics;  Laterality: Left;    There were no vitals filed for this visit.   Subjective Assessment - 10/17/22 1945     Subjective  My hand does not hurt as much just stiff and tight.  Using it more.  My wrist still hurts on the backside and cannot push-up or take weight on it.  And the scar is still tender.  My shoulder still bothers me when I reach to the side or overhead.    Pertinent History Ortho NOTE 07/26/22 -ORIF, left distal radius - 07/09/22, By Dr. [name] is a 62 y.o. female who presents today status post left distal radius ORIF by Dr. Kennedy Bucker on 07/12/2022. Patient has finished the Norco, has taken occasional ibuprofen only 3 tablets over the last couple of weeks. She is having some pain and swelling throughout the hand and wrist but overall symptoms are improving. She has performed very little exercises on her own, she is guarded  secondary to discomfort. She denies any numbness or tingling. Sutures removed. REFER to OT    Patient Stated Goals Want to get my L hand and wrist better to use it at home and work    Currently in Pain? Yes    Pain Score 6     Pain Location Shoulder    Pain Orientation Left    Pain Descriptors / Indicators Aching    Pain Type Acute pain    Pain Onset More than a month ago    Pain Frequency Intermittent                [facility] OT Assessment - 10/17/22 0001       AROM   Left Forearm Pronation 90 Degrees    Left Forearm Supination 90 Degrees    Left Wrist Extension 45 Degrees    Left Wrist Flexion 70 Degrees    Left Wrist Radial Deviation 20 Degrees    Left Wrist Ulnar Deviation 27 Degrees      Strength   Right Hand Grip (lbs) 43    Right Hand Lateral Pinch 12 lbs    Right Hand 3 Point Pinch 10 lbs    Left Hand Grip (lbs) 20    Left Hand Lateral Pinch 9 lbs    Left Hand 3 Point Pinch 7 lbs  OT Treatments/Exercises (OP) - 10/17/22 0001       LUE Paraffin   Number Minutes Paraffin 8 Minutes    LUE Paraffin Location Hand;Wrist    Comments prior to scar massaga , digits flexion strech and wrist extention            Patient focusing last few session and HEP  on wrist flexion /extension as well as composite fist. Continues to have soreness and pain over the dorsal wrist and hand   As well as pain and tenderness over the distal scar.  progress with flexion of digits. Patient to work on composite fist now.  Grip and 3 point did improving slowly . Patient reports she lost her putty. Provided her new putty - gripping as well as 3 point pinch and lateral pinch with green firm putty reviewed and add to HEP  Grip 2 sets of 12 reps and prehension 1 set of 12-15 reps 2 times a day  Patient is show some increased wrist flexion since last week. Extension about the same and pain over the dorsal wrist. Table slides 20 reps for wrist  extension Passive range of motion for wrist flexion/ext Ulnar and radial deviation within normal limits and 4+/5 strength Discomfort with supination pronation and range 4/5 strength. Reviewed with patient 2 pound weight for wrist and forearm in all planes. Patient did not do supination and pronation - forgot - started back last session Pain with wrist extension using 2 pound weight. Patient needs 50% of verbal and tactile cueing for following directions and doing exercises correctly. Pt pain and cannot push up from chair , pull or push heavy door, lift more than 4 lbs one handed Pt to follow up with surgeon tomorrow          OT Education - 10/17/22 1947     Education Details progress and reinforce HEP, positioning of left arm when not using    Person(s) Educated Patient    Methods Explanation;Demonstration;Tactile cues;Verbal cues;Handout    Comprehension Verbal cues required;Returned demonstration;Verbalized understanding              OT Short Term Goals - 10/17/22 1954       OT SHORT TERM GOAL #1   Title Pt to be ind in HEP to decrease edema , pain and increase ROM in digits to touch palm and opposition to all digits    Status Achieved               OT Long Term Goals - 10/17/22 1954       OT LONG TERM GOAL #1   Title L wrist AROM increase to [facility] for pt to use hand to turn doorknob and use more than 50% in bathing and dressing    Baseline Wrist AROM ext 10, flexion 50; sup 50 and RD 8, UD 20- not using hand, NOW using hand but wrist ext 45, flexion and RD, UD WFL    Time 2    Status On-going    Target Date 10/28/22      OT LONG TERM GOAL #2   Baseline Sterri srtrips removed today - applied one again - cannot initate scar massage yet- - decrease wrist and digits AROM, 10th visit:  improved toleration of scar massage, still working to increase AROM.  Recert:  scar still tender , wrist ext limited and pain dorsal wrist    Time 2    Period Weeks    Status  On-going    Target Date  10/28/22      OT LONG TERM GOAL #3   Title L Grip and prehension strenght incease to more than 60% compare to R hand for pt to carry more than 5 lbs, turn doorknob, cook using hand more than 50%    Baseline Pt can carry 7 lbs easy and lift once or twice- 8 lbs feels heavy and difficulty lifting - grip 20 lbs and prehensio WFL    Time 2    Period Weeks    Status On-going    Target Date 10/28/22      OT LONG TERM GOAL #4   Title L wrist and forearm strength increase to WNL for pt to push and pull heavy door, push up and turn doorknob and key without increase symptoms    Baseline cannot push up , or push and pull heavy door- wrist ext limited and grip - pain dorsal wrist and hand    Time 2    Period Weeks    Status On-going    Target Date 10/28/22                   Plan - 10/17/22 1948     Clinical Impression Statement Pt is more than   3 months s/p L ORIF distal radius fx - surgery by Dr Rosita Kea on 07/12/22. Pt was at eval limited greatly by edema, pain and stiffness in digits flexion and wrist and forearm motion.  Pt made great progress -but cont to be limited in wrist extention- at 45 degrees- in session can increase but now carry over - and pain with stretch and use always on dorsal wrist. Digits flexion improving -cont to be stiff and tight but with PROM and stretches can do composite fist, use putty and grip now 20 lbs. Prehension WNL. Pt cont to be tender at scar .Used for wrist extention stretches , CPM , PROM - pain mostly on dorsal wrist.  Last week assessed and add conditioning and simulation of work activities- 8lbs bilateral over head in front , 4 lbs with R hand and support with L and 2 lbs with L UE.    Pt continues to report  pain in left upper arm with over head ABD, ext and int rotation - about 6/10.  Pt to follow up with MD regarding her arm pain and limited ROM of wrist ext, pain on dorsal wrist, and tenderness at scar.  Pt to see surgeon tomorrow.     OT Occupational Profile and History Problem Focused Assessment - Including review of records relating to presenting problem    Occupational performance deficits (Please refer to evaluation for details): ADL's;IADL's;Leisure;Social Participation;Work;Play    Body Structure / Function / Physical Skills ADL;IADL;Strength;Pain;Edema;Sensation;Dexterity;Decreased knowledge of precautions;Flexibility;ROM;UE functional use;Scar mobility;FMC    Rehab Potential Good    Clinical Decision Making Limited treatment options, no task modification necessary    Comorbidities Affecting Occupational Performance: None    Modification or Assistance to Complete Evaluation  No modification of tasks or assist necessary to complete eval    OT Frequency 1x / week    OT Duration 4 weeks    OT Treatment/Interventions Self-care/ADL training;Contrast Bath;DME and/or AE instruction;Manual Therapy;Passive range of motion;Scar mobilization;Fluidtherapy;Paraffin;Splinting;Patient/family education;Therapeutic exercise    Consulted and Agree with Plan of Care Patient             Patient will benefit from skilled therapeutic intervention in order to improve the following deficits and impairments:   Body Structure / Function / Physical  Skills: ADL, IADL, Strength, Pain, Edema, Sensation, Dexterity, Decreased knowledge of precautions, Flexibility, ROM, UE functional use, Scar mobility, FMC       Visit Diagnosis: Pain in left hand  Stiffness of left wrist, not elsewhere classified  Muscle weakness (generalized)  Scar condition and fibrosis of skin  Pain in left wrist  Stiffness of left hand, not elsewhere classified    Problem List There are no problems to display for this patient.   Oletta Cohn, OTR/L,CLT 10/17/2022, 7:59 PM  [facility] [address] [place], [address] Phone: [phone]   Fax:  [phone]  Name: Carolyn Hogan MRN: [id number] Date of Birth: November 14, 1960

## 2023-01-21 ENCOUNTER — Other Ambulatory Visit: Payer: Self-pay | Admitting: Student

## 2023-01-21 ENCOUNTER — Ambulatory Visit
Admission: RE | Admit: 2023-01-21 | Discharge: 2023-01-21 | Disposition: A | Discharge status: Home / Self Care | Payer: BC Managed Care – PPO | Source: Ambulatory Visit | Attending: Student | Admitting: Student

## 2023-01-21 DIAGNOSIS — Z1231 Encounter for screening mammogram for malignant neoplasm of breast: Secondary | ICD-10-CM | POA: Diagnosis not present

## 2023-02-19 ENCOUNTER — Other Ambulatory Visit: Payer: Self-pay | Admitting: Family Medicine

## 2023-02-19 DIAGNOSIS — K5790 Diverticulosis of intestine, part unspecified, without perforation or abscess without bleeding: Secondary | ICD-10-CM

## 2023-02-27 ENCOUNTER — Ambulatory Visit: Admission: RE | Admit: 2023-02-27 | Payer: BC Managed Care – PPO | Source: Ambulatory Visit

## 2023-04-17 ENCOUNTER — Other Ambulatory Visit: Payer: Self-pay | Admitting: Physician Assistant

## 2023-04-17 DIAGNOSIS — R042 Hemoptysis: Secondary | ICD-10-CM

## 2023-08-18 ENCOUNTER — Other Ambulatory Visit: Payer: Self-pay | Admitting: Family Medicine

## 2023-08-18 DIAGNOSIS — R1032 Left lower quadrant pain: Secondary | ICD-10-CM

## 2023-08-28 ENCOUNTER — Ambulatory Visit
Admission: RE | Admit: 2023-08-28 | Discharge: 2023-08-28 | Disposition: A | Discharge status: Home / Self Care | Source: Ambulatory Visit | Attending: Family Medicine | Admitting: Family Medicine

## 2023-08-28 DIAGNOSIS — R1032 Left lower quadrant pain: Secondary | ICD-10-CM | POA: Diagnosis present

## 2024-01-21 ENCOUNTER — Other Ambulatory Visit: Payer: Self-pay | Admitting: Family Medicine

## 2024-01-21 DIAGNOSIS — Z1231 Encounter for screening mammogram for malignant neoplasm of breast: Secondary | ICD-10-CM

## 2024-03-03 ENCOUNTER — Ambulatory Visit
Admission: RE | Admit: 2024-03-03 | Discharge: 2024-03-03 | Disposition: A | Discharge status: Home / Self Care | Source: Ambulatory Visit | Attending: Family Medicine | Admitting: Family Medicine

## 2024-03-03 DIAGNOSIS — Z1231 Encounter for screening mammogram for malignant neoplasm of breast: Secondary | ICD-10-CM | POA: Diagnosis present

## 2024-05-04 IMAGING — MG MM DIGITAL SCREENING BILAT W/ TOMO AND CAD
6 of 10 series · 6 of 30 positions shown · non-contrast
Comparison: Previous exam(s).

CLINICAL DATA: Screening.

EXAM:
DIGITAL SCREENING BILATERAL MAMMOGRAM WITH TOMOSYNTHESIS AND CAD
TECHNIQUE: Bilateral screening digital craniocaudal and mediolateral oblique
mammograms were obtained. Bilateral screening digital breast
tomosynthesis was performed. The images were evaluated with
computer-aided detection.

[R MLO synth-2D]
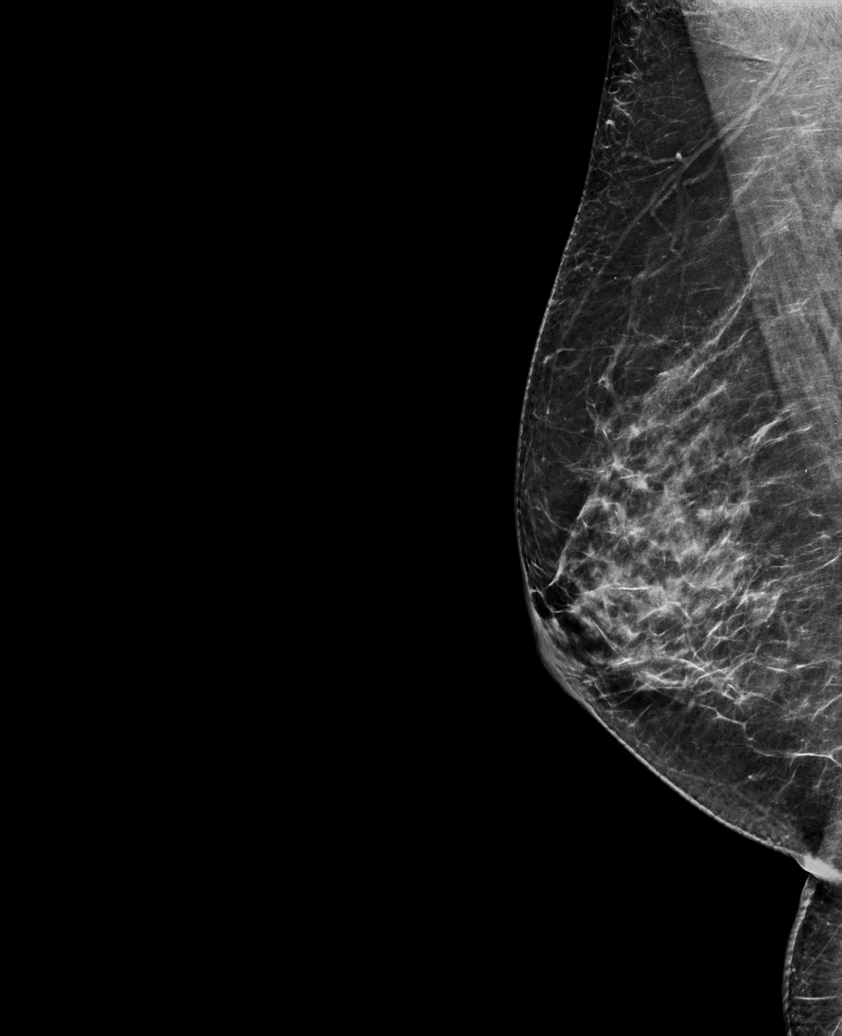

[R CC synth-2D]
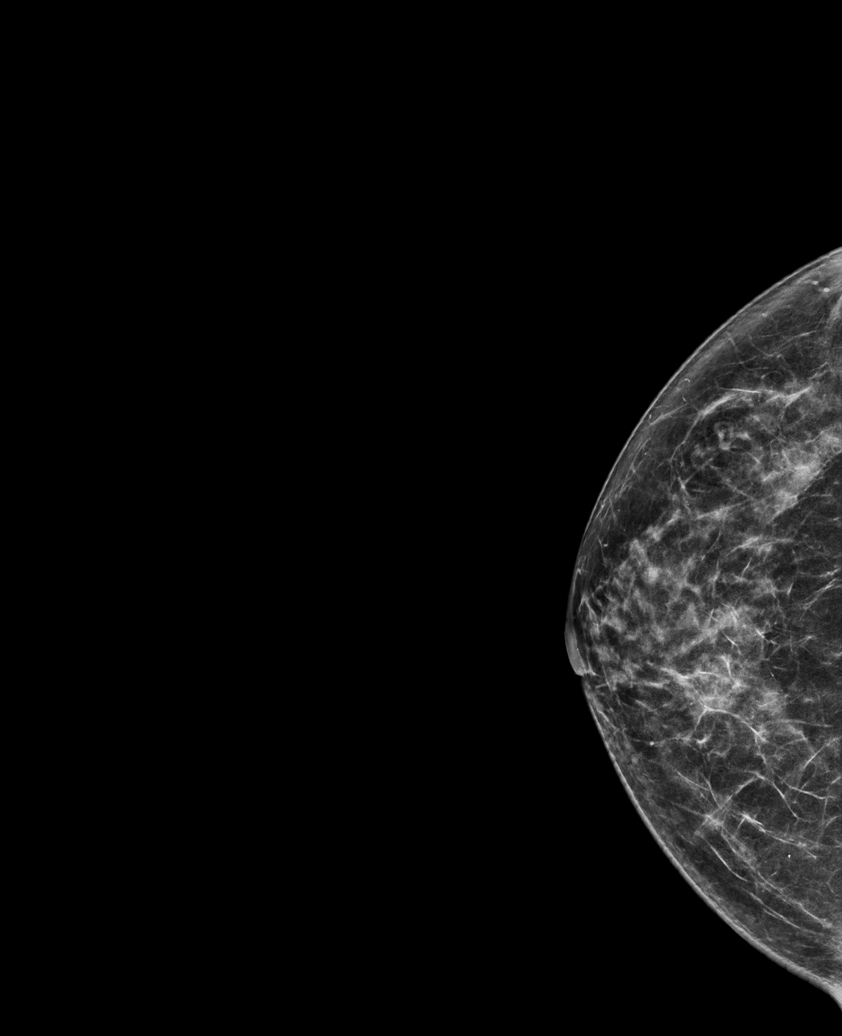

[L CC synth-2D (1 of 2)]
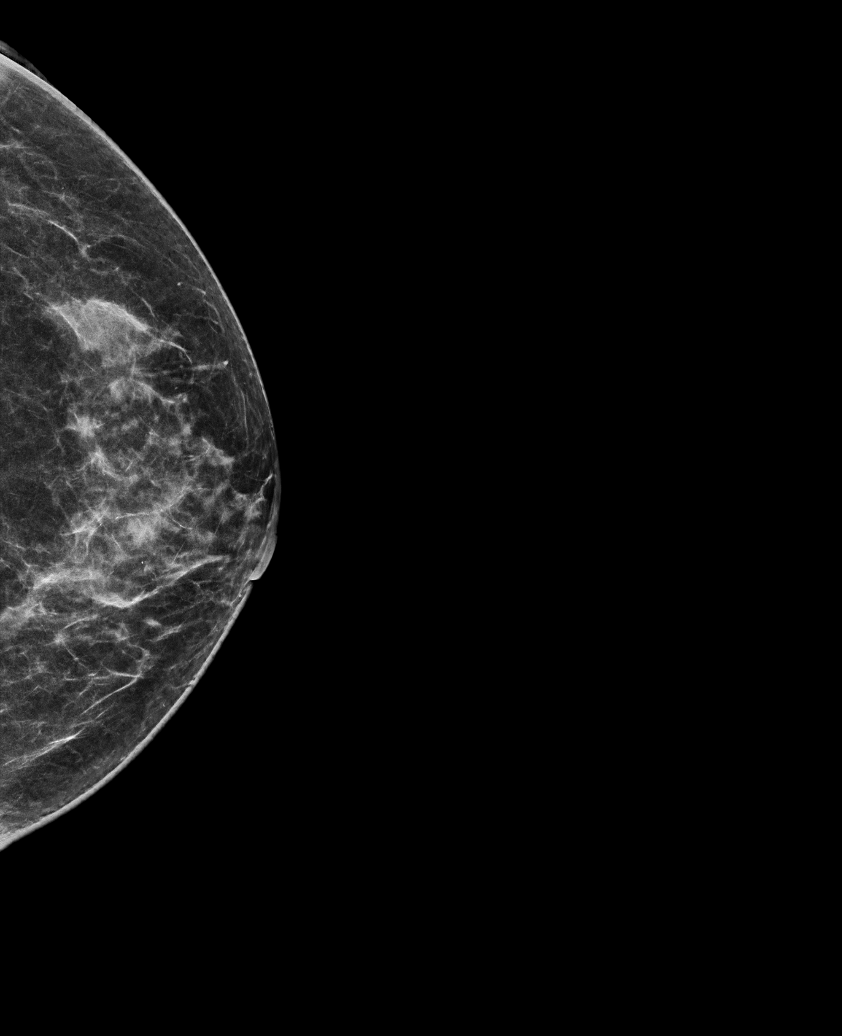

[L CC synth-2D (2 of 2)]
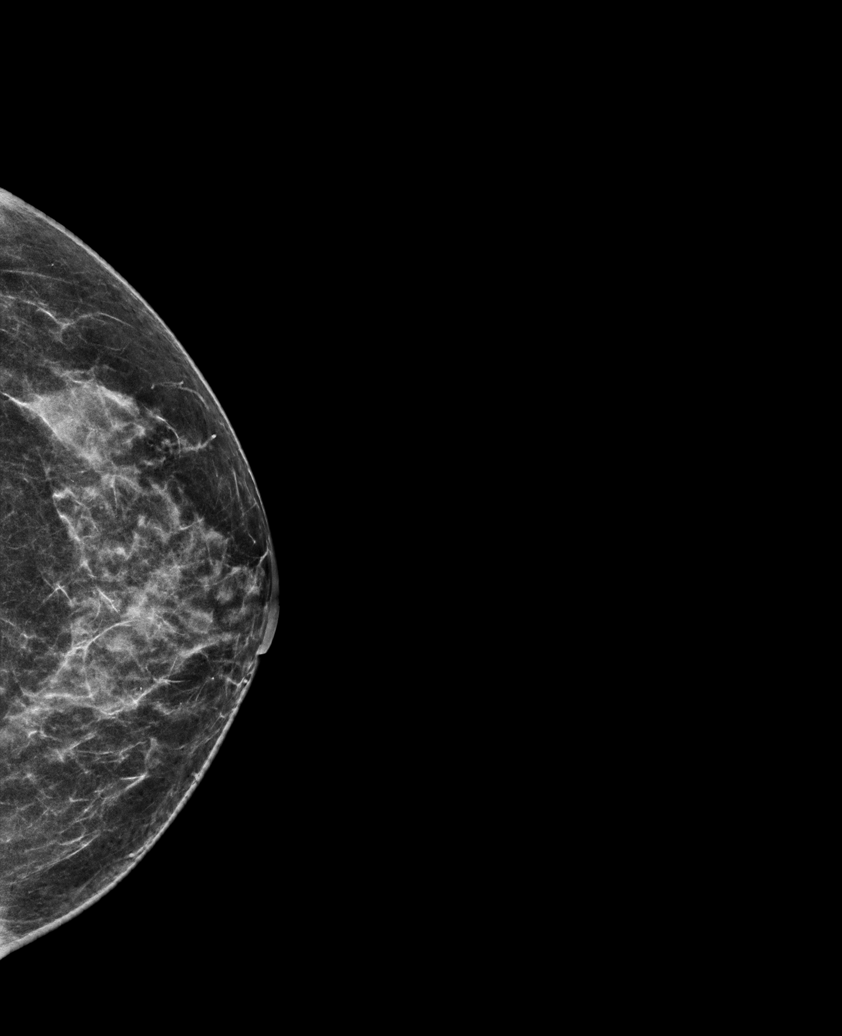

[L MLO synth-2D]
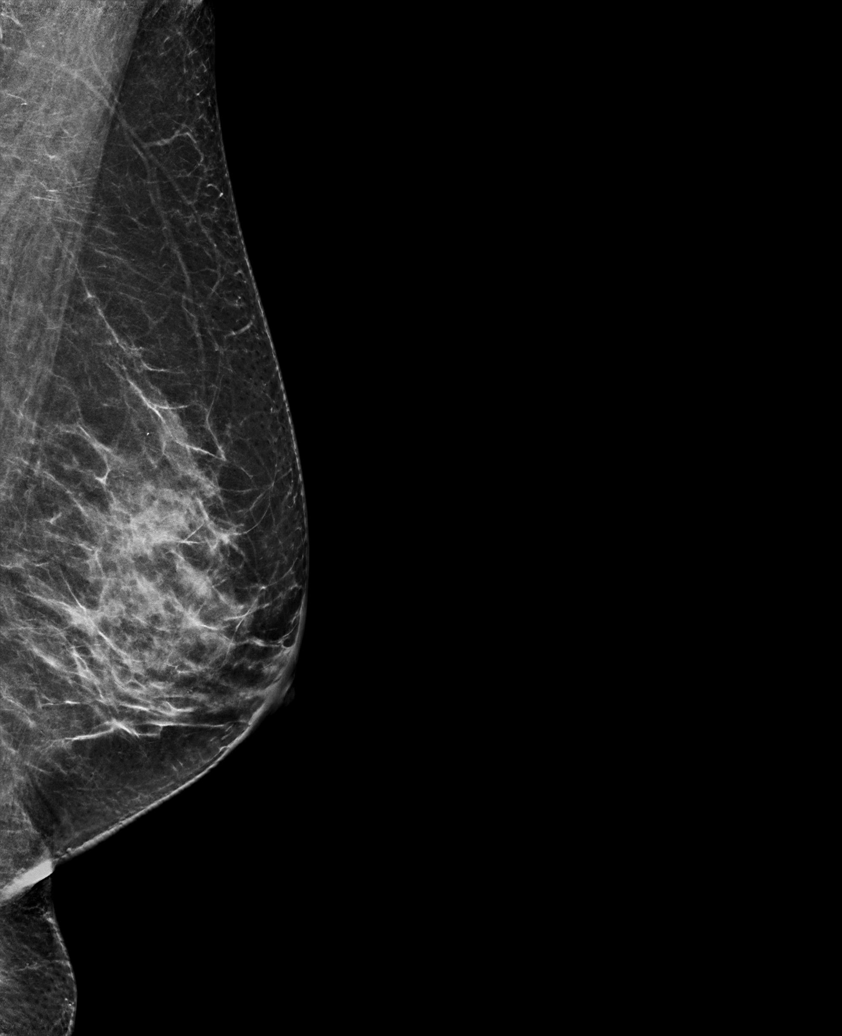

[L CC tomo · tomo slice 32/63.0]
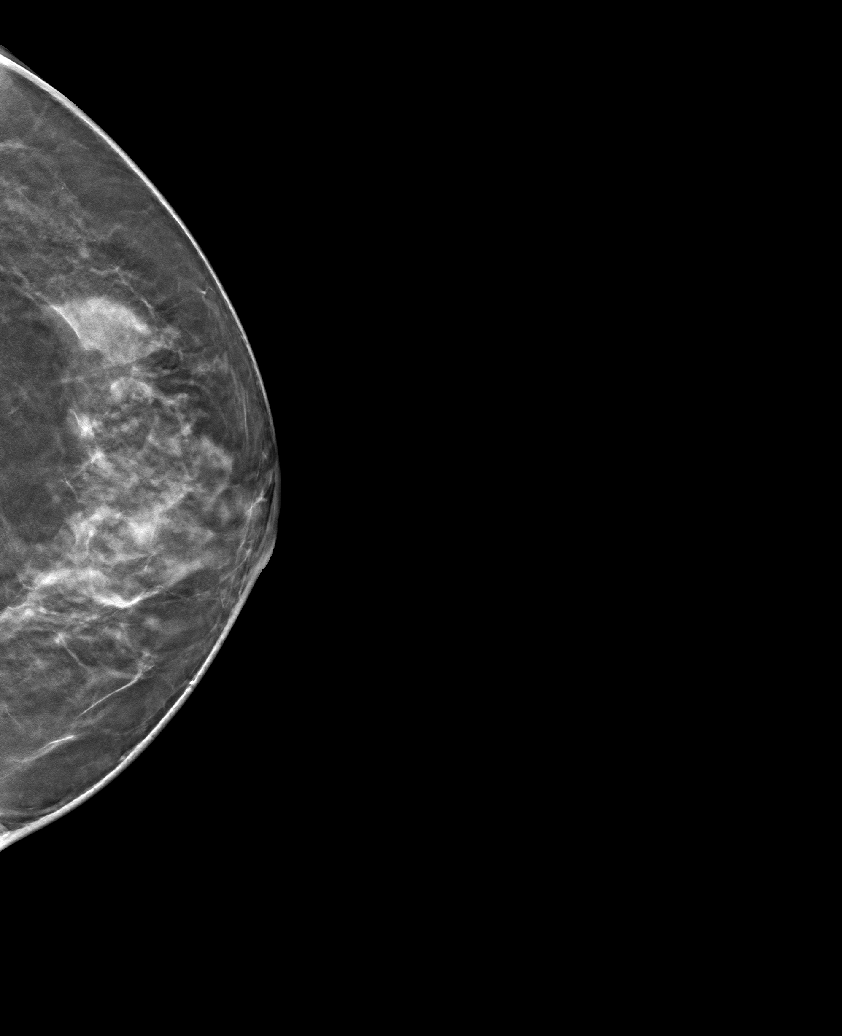

[6 of 30 positions shown; findings below may reference images not displayed]

ACR Breast Density Category d: The breast tissue is extremely dense,
which lowers the sensitivity of mammography
FINDINGS: There are no findings suspicious for malignancy.
IMPRESSION: No mammographic evidence of malignancy. A result letter of this
screening mammogram will be mailed directly to the patient.

RECOMMENDATION:
Screening mammogram in one year. (Code:TA-V-WV9)

BI-RADS CATEGORY  1: Negative.
